# Patient Record
Sex: Male | Born: 1995
Health system: Southern US, Community
[De-identification: ages and names within clinical notes are randomized; demographics above are authoritative.]

## PROBLEM LIST (undated history)

## (undated) DIAGNOSIS — Z8679 Personal history of other diseases of the circulatory system: Secondary | ICD-10-CM

## (undated) DIAGNOSIS — K219 Gastro-esophageal reflux disease without esophagitis: Secondary | ICD-10-CM

## (undated) DIAGNOSIS — H7291 Unspecified perforation of tympanic membrane, right ear: Secondary | ICD-10-CM

## (undated) DIAGNOSIS — T7840XA Allergy, unspecified, initial encounter: Secondary | ICD-10-CM

## (undated) DIAGNOSIS — R1115 Cyclical vomiting syndrome unrelated to migraine: Secondary | ICD-10-CM

## (undated) DIAGNOSIS — E538 Deficiency of other specified B group vitamins: Secondary | ICD-10-CM

## (undated) HISTORY — PX: WISDOM TOOTH EXTRACTION: SHX21

## (undated) HISTORY — DX: Unspecified perforation of tympanic membrane, right ear: H72.91

## (undated) HISTORY — DX: Allergy, unspecified, initial encounter: T78.40XA

## (undated) HISTORY — PX: TYMPANOSTOMY TUBE PLACEMENT: SHX32

## (undated) HISTORY — DX: Deficiency of other specified B group vitamins: E53.8

## (undated) HISTORY — DX: Gastro-esophageal reflux disease without esophagitis: K21.9

## (undated) HISTORY — DX: Personal history of other diseases of the circulatory system: Z86.79

## (undated) HISTORY — DX: Cyclical vomiting syndrome unrelated to migraine: R11.15

---
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
## 2016-06-14 DIAGNOSIS — H9211 Otorrhea, right ear: Secondary | ICD-10-CM | POA: Insufficient documentation

## 2016-06-22 DIAGNOSIS — H9011 Conductive hearing loss, unilateral, right ear, with unrestricted hearing on the contralateral side: Secondary | ICD-10-CM | POA: Insufficient documentation

## 2016-07-04 ENCOUNTER — Other Ambulatory Visit: Payer: Self-pay | Admitting: Otolaryngology

## 2016-07-04 DIAGNOSIS — H7121 Cholesteatoma of mastoid, right ear: Secondary | ICD-10-CM

## 2016-07-04 DIAGNOSIS — H9011 Conductive hearing loss, unilateral, right ear, with unrestricted hearing on the contralateral side: Secondary | ICD-10-CM

## 2016-07-06 ENCOUNTER — Other Ambulatory Visit: Payer: Self-pay

## 2016-07-12 ENCOUNTER — Ambulatory Visit
Admission: RE | Admit: 2016-07-12 | Discharge: 2016-07-12 | Disposition: A | Discharge status: Home / Self Care | Payer: Self-pay | Source: Ambulatory Visit | Attending: Otolaryngology | Admitting: Otolaryngology

## 2016-07-12 DIAGNOSIS — H7121 Cholesteatoma of mastoid, right ear: Secondary | ICD-10-CM

## 2016-07-12 DIAGNOSIS — H9011 Conductive hearing loss, unilateral, right ear, with unrestricted hearing on the contralateral side: Secondary | ICD-10-CM

## 2017-03-20 ENCOUNTER — Telehealth: Payer: Self-pay | Admitting: *Deleted

## 2017-03-20 NOTE — Telephone Encounter (Signed)
Received records from [facility].  Records have been put on Dr. Samul Dada desk for review.

## 2017-03-24 ENCOUNTER — Encounter: Payer: Self-pay | Admitting: Family Medicine

## 2017-03-27 ENCOUNTER — Encounter: Payer: Self-pay | Admitting: Family Medicine

## 2017-03-27 ENCOUNTER — Ambulatory Visit (INDEPENDENT_AMBULATORY_CARE_PROVIDER_SITE_OTHER): Payer: Managed Care, Other (non HMO) | Admitting: Family Medicine

## 2017-03-27 VITALS — BP 128/70 | HR 72 | Temp 98.7°F | Resp 16 | Ht 68.5 in | Wt 188.5 lb

## 2017-03-27 DIAGNOSIS — G43A0 Cyclical vomiting, not intractable: Secondary | ICD-10-CM | POA: Diagnosis not present

## 2017-03-27 DIAGNOSIS — R1084 Generalized abdominal pain: Secondary | ICD-10-CM

## 2017-03-27 DIAGNOSIS — R1115 Cyclical vomiting syndrome unrelated to migraine: Secondary | ICD-10-CM

## 2017-03-27 MED ORDER — OMEPRAZOLE 40 MG PO CPDR: 40.0000 mg | DELAYED_RELEASE_CAPSULE | Freq: Every day | ORAL | 3 refills | Status: DC

## 2017-03-27 MED ORDER — AMITRIPTYLINE HCL 25 MG PO TABS: 25.0000 mg | ORAL_TABLET | Freq: Every day | ORAL | 0 refills | Status: DC

## 2017-03-27 NOTE — Progress Notes (Signed)
Office Note 03/27/2017  CC:  Chief Complaint  Patient presents with  . Establish Care    Previous PCP: [facility] [place], [place]  . Digestive Issues   HPI:  Martin Terry is a 21 y.o.  male who is here to establish care Patient's most recent primary MD: MD in [place], [place]. Old records were reviewed prior to or during today's visit.  He also brought in a record of GTT done 10/2014.  This was done b/c he was having some fatigue problems. However, the paperwork does not have any result on it.  A cbc attached is normal.  Approx the last few months he has intermittent postprandial upper abd pain with nausea but no vomiting.  Happens on avg about 1-2 times per week, and this initially was happening 1-2 times per month. Not triggered by any particular food.  Usually happens at supper meal.  Usually happens while he is still eating. He says it seems to resolve after 15 min or so, goes through a lot of belching during this time. Feels bloated during this time.  No diarrhea.  No lower abd cramping.   Hx of cyclic vomiting in past that was responsive to getting under hot water/shower.  He does this when current sx's occur and this helps.  Says his past episodes of cyclic vomiting started like this. Was rx'd a med in past to take prior to an episode back in 2012 but he can't recall what it was.  ?migraine med? He can't recall what/if any imaging or blood w/u was done regarding his hx of cyclic vomiting.   Past Medical History:  Diagnosis Date  . Allergy   . Cyclical vomiting 2005   2005, again 2012.  Marland Kitchen GERD (gastroesophageal reflux disease)   . History of heart murmur in childhood    Age 48; eval by cardiologist for a few episodes of mildly elevated bp,  This cardiac eval was normal, including echo and EKG (2012).    Past Surgical History:  Procedure Laterality Date  . ADENOIDECTOMY  2000  . TYMPANOSTOMY TUBE PLACEMENT     with hx of TM perf on R as of 08/2016 f/u with Dr.  Jenne Pane.  Hx of conductive hearing loss.  . WISDOM TOOTH EXTRACTION      Family History  Problem Relation Age of Onset  . Miscarriages / [place] Mother   . Hearing loss Father   . Hyperlipidemia Father   . Arthritis Maternal Grandmother   . Hyperlipidemia Maternal Grandmother   . Cancer Maternal Grandfather   . COPD Maternal Grandfather   . Diabetes Maternal Grandfather   . Cancer Paternal Grandmother   . Early death Paternal Grandmother   . Scleroderma Paternal Grandmother   . Hearing loss Paternal Grandfather   . Hyperlipidemia Paternal Grandfather     Social History   Social History  . Marital status: Single    Spouse name: N/A  . Number of children: N/A  . Years of education: N/A   Occupational History  . Not on file.   Social History Main Topics  . Smoking status: Never Smoker  . Smokeless tobacco: Never Used  . Alcohol use No  . Drug use: No  . Sexual activity: Not on file   Other Topics Concern  . Not on file   Social History Narrative   Single, no children.   Educ: [employer].A[employer]   Relocated from [place] to [facility] 2016.   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  No T/A/Ds.    Outpatient Encounter Prescriptions as of 03/27/2017  Medication Sig  . amitriptyline (ELAVIL) 25 MG tablet Take 1 tablet (25 mg total) by mouth at bedtime.  Marland Kitchen omeprazole (PRILOSEC) 40 MG capsule Take 1 capsule (40 mg total) by mouth daily.   No facility-administered encounter medications on file as of 03/27/2017.     Allergies  Allergen Reactions  . Ondansetron Hcl     Other reaction(s): Other (See Comments) unknown    ROS Review of Systems  Constitutional: Negative for fatigue and fever.  HENT: Negative for congestion and sore throat.   Eyes: Negative for visual disturbance.  Respiratory: Negative for cough.   Cardiovascular: Negative for chest pain.  Gastrointestinal: Positive for abdominal pain and nausea.  Genitourinary: Negative for dysuria.   Musculoskeletal: Negative for back pain and joint swelling.  Skin: Negative for rash.  Neurological: Negative for weakness and headaches.  Hematological: Negative for adenopathy.    PE; Blood pressure 128/70, pulse 72, temperature 98.7 F (37.1 C), temperature source Oral, resp. rate 16, height 5' 8.5" (1.74 m), weight 188 lb 8 oz (85.5 kg), SpO2 98 %. Body mass index is 28.24 kg/m.  Gen: Alert, well appearing.  Patient is oriented to person, place, time, and situation. AFFECT: pleasant, lucid thought and speech. Neck - No masses or thyromegaly or limitation in range of motion CV: RRR, no m/r/g.   LUNGS: CTA bilat, nonlabored resps, good aeration in all lung fields. ABD: soft, NT, ND, BS normal.  No hepatospenomegaly or mass.  No bruits. EXT: no clubbing, cyanosis, or edema.  Skin - no sores or suspicious lesions or rashes or color changes  Pertinent labs:  None  ASSESSMENT AND PLAN:   Cyclic vomiting syndrome: this is the way his 2 episodes of cyclic vomiting syndrome have started out in the past. Will check abd u/s to r/o cholelithiasis.  Also, check CBC, CMET, lipase. Start amitriptyline  qhs for prophylaxis treatment [facility].  Therapeutic expectations and side effect profile of medication discussed today.  Patient's questions answered. Discussed possible need for up-titration slowly. May consider triptan trial as abortive med if episodes become more severe or extended.  An After Visit Summary was printed and given to the patient.  Return in about 2 weeks (around 04/10/2017) for f/u abd pain/nausea.  Signed:  Santiago Bumpers, MD           03/27/2017

## 2017-03-28 ENCOUNTER — Other Ambulatory Visit: Payer: Self-pay | Admitting: Family Medicine

## 2017-03-28 ENCOUNTER — Encounter: Payer: Self-pay | Admitting: Family Medicine

## 2017-03-28 ENCOUNTER — Telehealth: Payer: Self-pay | Admitting: *Deleted

## 2017-03-28 LAB — CBC WITH DIFFERENTIAL/PLATELET
BASOS PCT: 1.2 % (ref 0.0–3.0)
Basophils Absolute: 0.1 10*3/uL (ref 0.0–0.1)
EOS ABS: 0.2 10*3/uL (ref 0.0–0.7)
Eosinophils Relative: 3.1 % (ref 0.0–5.0)
HCT: 45.3 % (ref 39.0–52.0)
Hemoglobin: 15.4 g/dL (ref 13.0–17.0)
LYMPHS ABS: 1.7 10*3/uL (ref 0.7–4.0)
Lymphocytes Relative: 33.8 % (ref 12.0–46.0)
MCHC: 33.9 g/dL (ref 30.0–36.0)
MCV: 87.3 fl (ref 78.0–100.0)
MONO ABS: 0.3 10*3/uL (ref 0.1–1.0)
Monocytes Relative: 6 % (ref 3.0–12.0)
NEUTROS ABS: 2.9 10*3/uL (ref 1.4–7.7)
Neutrophils Relative %: 55.9 % (ref 43.0–77.0)
PLATELETS: 248 10*3/uL (ref 150.0–400.0)
RBC: 5.19 Mil/uL (ref 4.22–5.81)
RDW: 13.3 % (ref 11.5–15.5)
WBC: 5.1 10*3/uL (ref 4.0–10.5)

## 2017-03-28 LAB — COMPREHENSIVE METABOLIC PANEL
ALBUMIN: 5 g/dL (ref 3.5–5.2)
ALT: 20 U/L (ref 0–53)
AST: 16 U/L (ref 0–37)
Alkaline Phosphatase: 47 U/L (ref 39–117)
BUN: 17 mg/dL (ref 6–23)
CALCIUM: 9.9 mg/dL (ref 8.4–10.5)
CHLORIDE: 103 meq/L (ref 96–112)
CO2: 30 mEq/L (ref 19–32)
Creatinine, Ser: 1.22 mg/dL (ref 0.40–1.50)
GFR: 78.26 mL/min (ref >60.00)
Glucose, Bld: 101 mg/dL — ABNORMAL HIGH (ref 70–99)
POTASSIUM: 4.3 meq/L (ref 3.5–5.1)
SODIUM: 139 meq/L (ref 135–145)
Total Bilirubin: 0.5 mg/dL (ref 0.2–1.2)
Total Protein: 7.2 g/dL (ref 6.0–8.3)

## 2017-03-28 LAB — LIPASE: LIPASE: 28 U/L (ref 11.0–59.0)

## 2017-03-28 MED ORDER — CYPROHEPTADINE HCL 4 MG PO TABS: ORAL_TABLET | ORAL | 1 refills | Status: DC

## 2017-03-28 NOTE — Telephone Encounter (Signed)
Received records from [facility].  Records put on Dr. Verdis Frederickson desk for review.

## 2017-03-28 NOTE — Telephone Encounter (Signed)
Please advise. Thanks.  

## 2017-03-29 ENCOUNTER — Encounter: Payer: Self-pay | Admitting: *Deleted

## 2017-03-31 ENCOUNTER — Encounter: Payer: Self-pay | Admitting: Family Medicine

## 2017-03-31 NOTE — Telephone Encounter (Signed)
Reviewed

## 2017-04-01 NOTE — Telephone Encounter (Signed)
Reviewed.  Signed:  Santiago Bumpers, MD           04/01/2017

## 2017-04-02 ENCOUNTER — Ambulatory Visit (HOSPITAL_BASED_OUTPATIENT_CLINIC_OR_DEPARTMENT_OTHER)
Admission: RE | Admit: 2017-04-02 | Discharge: 2017-04-02 | Disposition: A | Discharge status: Home / Self Care | Payer: Managed Care, Other (non HMO) | Source: Ambulatory Visit | Attending: Family Medicine | Admitting: Family Medicine

## 2017-04-02 ENCOUNTER — Encounter: Payer: Self-pay | Admitting: Family Medicine

## 2017-04-02 ENCOUNTER — Encounter: Payer: Self-pay | Admitting: *Deleted

## 2017-04-02 DIAGNOSIS — G43A0 Cyclical vomiting, not intractable: Secondary | ICD-10-CM | POA: Diagnosis present

## 2017-04-02 DIAGNOSIS — R1115 Cyclical vomiting syndrome unrelated to migraine: Secondary | ICD-10-CM

## 2017-04-02 DIAGNOSIS — R1084 Generalized abdominal pain: Secondary | ICD-10-CM | POA: Diagnosis not present

## 2017-04-15 ENCOUNTER — Encounter: Payer: Self-pay | Admitting: Family Medicine

## 2017-04-15 ENCOUNTER — Ambulatory Visit (INDEPENDENT_AMBULATORY_CARE_PROVIDER_SITE_OTHER): Payer: Managed Care, Other (non HMO) | Admitting: Family Medicine

## 2017-04-15 VITALS — BP 119/67 | HR 72 | Temp 98.4°F | Resp 16 | Ht 68.5 in | Wt 188.0 lb

## 2017-04-15 DIAGNOSIS — G43A0 Cyclical vomiting, not intractable: Secondary | ICD-10-CM

## 2017-04-15 DIAGNOSIS — K219 Gastro-esophageal reflux disease without esophagitis: Secondary | ICD-10-CM | POA: Diagnosis not present

## 2017-04-15 DIAGNOSIS — R1115 Cyclical vomiting syndrome unrelated to migraine: Secondary | ICD-10-CM

## 2017-04-15 NOTE — Progress Notes (Signed)
OFFICE VISIT  04/15/2017   CC:  Chief Complaint  Patient presents with  . Follow-up    Abdominal Pain and Nausea   HPI:    Patient is a 21 y.o.  male who presents accompanied by his mom for 3 week f/u abdominal complaints that were worrisome for the beginning of another episode of cyclic vomiting.  Abd u/s done right after last visit was normal. He feels improved.    No more nausea.  Just lots of burping after he eats.  Admits to indigestion and sometimes a burning sensation in substernal area.  Takes tums and sometimes gas-ex and these help.  He did not fill the prilosec that I rx'd last visit.  He felt like he did not need to start periactin or amitriptyline b/c sx's abated since last visit. He is in favor of establishing with a local GI MD given his history of significant cyclic vomiting syndrome.    Past Medical History:  Diagnosis Date  . Allergy   . Cyclical vomiting 2005   2005, again 2012.  Abd u/s NORMAL 04/2017.  Marland Kitchen GERD (gastroesophageal reflux disease)    Celiac w/u neg, 2H GTT NORMAL (2016)  . History of heart murmur in childhood    Age 17; eval by cardiologist for a few episodes of mildly elevated bp,  This cardiac eval was normal, including echo and EKG (2012).    Past Surgical History:  Procedure Laterality Date  . ADENOIDECTOMY  2000  . TYMPANOSTOMY TUBE PLACEMENT     with hx of TM perf on R as of 08/2016 f/u with Dr. Jenne Pane.  Hx of conductive hearing loss.  . WISDOM TOOTH EXTRACTION      Outpatient Medications Prior to Visit  Medication Sig Dispense Refill  . amitriptyline (ELAVIL) 25 MG tablet Take 1 tablet (25 mg total) by mouth at bedtime. (Patient not taking: Reported on 04/15/2017) 30 tablet 0  . cyproheptadine (PERIACTIN) 4 MG tablet 1 tab po tid as needed for abdominal and nausea symptoms (Patient not taking: Reported on 04/15/2017) 45 tablet 1  . omeprazole (PRILOSEC) 40 MG capsule Take 1 capsule (40 mg total) by mouth daily. (Patient not taking:  Reported on 04/15/2017) 30 capsule 3   No facility-administered medications prior to visit.     Allergies  Allergen Reactions  . Ondansetron Hcl     Other reaction(s): Other (See Comments) unknown    ROS As per HPI  PE: Blood pressure 119/67, pulse 72, temperature 98.4 F (36.9 C), temperature source Oral, resp. rate 16, height 5' 8.5" (1.74 m), weight 188 lb (85.3 kg), SpO2 100 %. Gen: Alert, well appearing.  Patient is oriented to person, place, time, and situation. AFFECT: pleasant, lucid thought and speech. No further exam today.  LABS:  No results found for: TSH Lab Results  Component Value Date   WBC 5.1 03/27/2017   HGB 15.4 03/27/2017   HCT 45.3 03/27/2017   MCV 87.3 03/27/2017   PLT 248.0 03/27/2017   Lab Results  Component Value Date   CREATININE 1.22 03/27/2017   BUN 17 03/27/2017   NA 139 03/27/2017   K 4.3 03/27/2017   CL 103 03/27/2017   CO2 30 03/27/2017   Lab Results  Component Value Date   ALT 20 03/27/2017   AST 16 03/27/2017   ALKPHOS 47 03/27/2017   BILITOT 0.5 03/27/2017    IMPRESSION AND PLAN:  1) Cyclic vomiting syndrome: it appears this is still "in remission". He has periactin at  home to start if needed, also will get in hot shower as needed to quell any symptoms that occasionally pop up. Referred to Dr. Rhea Belton with [facility] so pt could establish with local GI MD.  2) GERD: instructions---Take omeprazole as prescribed last visit. After you have felt significant improvement/resolution of your symptoms, either change to over the counter generic zantac  1-2 times per day for 2 weeks OR change to over the counter strength prilosec daily for 2 weeks, then stop meds. GERD educational handout reviewed and given to pt.  An After Visit Summary was printed and given to the patient.  FOLLOW UP: Return if symptoms worsen or fail to improve.  Signed:  Santiago Bumpers, MD           04/15/2017

## 2017-04-15 NOTE — Patient Instructions (Signed)
Take omeprazole as prescribed last visit.  After you have felt significant improvement/resolution of your symptoms, either change to over the counter generic zantac  1-2 times per day for 2 weeks OR change to over the counter strength prilosec daily for 2 weeks, then stop meds.  Gastroesophageal Reflux Disease, Adult Normally, food travels down the esophagus and stays in the stomach to be digested. However, when a person has gastroesophageal reflux disease (GERD), food and stomach acid move back up into the esophagus. When this happens, the esophagus becomes sore and inflamed. Over time, GERD can create small holes (ulcers) in the lining of the esophagus. What are the causes? This condition is caused by a problem with the muscle between the esophagus and the stomach (lower esophageal sphincter, or LES). Normally, the LES muscle closes after food passes through the esophagus to the stomach. When the LES is weakened or abnormal, it does not close properly, and that allows food and stomach acid to go back up into the esophagus. The LES can be weakened by certain dietary substances, medicines, and medical conditions, including:  Tobacco use.  Pregnancy.  Having a hiatal hernia.  Heavy alcohol use.  Certain foods and beverages, such as coffee, chocolate, onions, and peppermint.  What increases the risk? This condition is more likely to develop in:  People who have an increased body weight.  People who have connective tissue disorders.  People who use NSAID medicines.  What are the signs or symptoms? Symptoms of this condition include:  Heartburn.  Difficult or painful swallowing.  The feeling of having a lump in the throat.  Abitter taste in the mouth.  Bad breath.  Having a large amount of saliva.  Having an upset or bloated stomach.  Belching.  Chest pain.  Shortness of breath or wheezing.  Ongoing (chronic) cough or a night-time cough.  Wearing away of tooth  enamel.  Weight loss.  Different conditions can cause chest pain. Make sure to see your health care provider if you experience chest pain. How is this diagnosed? Your health care provider will take a medical history and perform a physical exam. To determine if you have mild or severe GERD, your health care provider may also monitor how you respond to treatment. You may also have other tests, including:  An endoscopy toexamine your stomach and esophagus with a small camera.  A test thatmeasures the acidity level in your esophagus.  A test thatmeasures how much pressure is on your esophagus.  A barium swallow or modified barium swallow to show the shape, size, and functioning of your esophagus.  How is this treated? The goal of treatment is to help relieve your symptoms and to prevent complications. Treatment for this condition may vary depending on how severe your symptoms are. Your health care provider may recommend:  Changes to your diet.  Medicine.  Surgery.  Follow these instructions at home: Diet  Follow a diet as recommended by your health care provider. This may involve avoiding foods and drinks such as: ? Coffee and tea (with or without caffeine). ? Drinks that containalcohol. ? Energy drinks and sports drinks. ? Carbonated drinks or sodas. ? Chocolate and cocoa. ? Peppermint and mint flavorings. ? Garlic and onions. ? Horseradish. ? Spicy and acidic foods, including peppers, chili powder, curry powder, vinegar, hot sauces, and barbecue sauce. ? Citrus fruit juices and citrus fruits, such as oranges, lemons, and limes. ? Tomato-based foods, such as red sauce, chili, salsa, and pizza with red  sauce. ? [place] and fatty foods, such as donuts, french fries, potato chips, and high-fat dressings. ? High-fat meats, such as hot dogs and fatty cuts of red and white meats, such as rib eye steak, sausage, ham, and bacon. ? High-fat dairy items, such as whole milk, butter, and  cream cheese.  Eat small, frequent meals instead of large meals.  Avoid drinking large amounts of liquid with your meals.  Avoid eating meals during the 2-3 hours before bedtime.  Avoid lying down right after you eat.  Do not exercise right after you eat. General instructions  Pay attention to any changes in your symptoms.  Take over-the-counter and prescription medicines only as told by your health care provider. Do not take aspirin, ibuprofen, or other NSAIDs unless your health care provider told you to do so.  Do not use any tobacco products, including cigarettes, chewing tobacco, and e-cigarettes. If you need help quitting, ask your health care provider.  Wear loose-fitting clothing. Do not wear anything tight around your waist that causes pressure on your abdomen.  Raise (elevate) the head of your bed 6 inches (15cm).  Try to reduce your stress, such as with yoga or meditation. If you need help reducing stress, ask your health care provider.  If you are overweight, reduce your weight to an amount that is healthy for you. Ask your health care provider for guidance about a safe weight loss goal.  Keep all follow-up visits as told by your health care provider. This is important. Contact a health care provider if:  You have new symptoms.  You have unexplained weight loss.  You have difficulty swallowing, or it hurts to swallow.  You have wheezing or a persistent cough.  Your symptoms do not improve with treatment.  You have a hoarse voice. Get help right away if:  You have pain in your arms, neck, jaw, teeth, or back.  You feel sweaty, dizzy, or light-headed.  You have chest pain or shortness of breath.  You vomit and your vomit looks like blood or coffee grounds.  You faint.  Your stool is bloody or black.  You cannot swallow, drink, or eat. This information is not intended to replace advice given to you by your health care provider. Make sure you discuss any  questions you have with your health care provider. Document Released: 03/28/2005 Document Revised: 11/16/2015 Document Reviewed: 10/13/2014 Elsevier Interactive Patient Education  2017 [employer].

## 2017-05-06 ENCOUNTER — Telehealth: Payer: Self-pay | Admitting: *Deleted

## 2017-05-06 NOTE — Telephone Encounter (Signed)
Noted  

## 2017-05-06 NOTE — Telephone Encounter (Signed)
Pts mother called stating that pt has finished the 14 day supply of omeprazole. She stated that his symptoms were improving but once he stopped the medication they came back. She wanted to know what pt should do. They are currently our of town. I reviewed last office visit and read off Dr. Samul DadaMcGowen's instructions to take Rx omeprazole as prescribed at last visit then start otc generic zantac or otc Prilosec x 2 weeks. She voiced understanding and will start pt on the Rx omeprazole.

## 2017-05-08 ENCOUNTER — Telehealth: Payer: Self-pay | Admitting: *Deleted

## 2017-05-08 NOTE — Telephone Encounter (Signed)
Pts mother advised and voiced understanding, okay per DPR.  

## 2017-05-08 NOTE — Telephone Encounter (Signed)
Pts mother LMOM on 05/08/17 at 2:48pm stating that [place] and pt was [facility] for cyclical vomiting. She stated that the ER didn't do much to help pt. She stated that they told her his WBC count was up but he did not have a fever. She stated that in the past Dr. Milinda CaveMcGowen has sent in antibiotic for pt and that has helped. She is requesting an antibiotic be sent to [facility] in [place], [place]. Please advise. Thanks.

## 2017-05-08 NOTE — Telephone Encounter (Signed)
SW pts mother, she stated that pts last cycle of this was in 2012 and he was seen at 2 different ERs for this. She stated that it was found that pt had an infection and they seemed to be what triggered his vomiting. She stated that no one looked at his ears when he was in the ER this last time. I advised her it may be best to take pt to an urgent care and have someone check his ears to see if he has an ear infection. She seemed hesitant because its had to get pt in a car when he is vomiting. I advised her that I will send this message back to Dr. Milinda CaveMcGowen and see what he will recommend. Please advise. Thanks.

## 2017-05-08 NOTE — Telephone Encounter (Signed)
Agree with recommendation to take pt to an urgent care center.-thx

## 2017-05-08 NOTE — Telephone Encounter (Signed)
There must be some confusion. I have seen him only twice, and I have never prescribed him an antibiotic. I have rx'd amitriptyline and generic prilosec, and he has taken periactin for his cyclic vomiting in the past. Is one of these meds possibly what his mom is referring to?

## 2017-05-14 ENCOUNTER — Encounter: Payer: Self-pay | Admitting: Internal Medicine

## 2017-05-16 ENCOUNTER — Other Ambulatory Visit: Payer: Self-pay

## 2017-05-16 ENCOUNTER — Encounter: Payer: Self-pay | Admitting: Family Medicine

## 2017-05-16 ENCOUNTER — Ambulatory Visit (INDEPENDENT_AMBULATORY_CARE_PROVIDER_SITE_OTHER): Payer: Managed Care, Other (non HMO) | Admitting: Family Medicine

## 2017-05-16 VITALS — BP 131/72 | HR 82 | Temp 98.2°F | Resp 16 | Ht 68.5 in | Wt 180.5 lb

## 2017-05-16 DIAGNOSIS — Z8489 Family history of other specified conditions: Secondary | ICD-10-CM

## 2017-05-16 DIAGNOSIS — H7291 Unspecified perforation of tympanic membrane, right ear: Secondary | ICD-10-CM

## 2017-05-16 DIAGNOSIS — H65491 Other chronic nonsuppurative otitis media, right ear: Secondary | ICD-10-CM | POA: Diagnosis not present

## 2017-05-16 DIAGNOSIS — G43A0 Cyclical vomiting, not intractable: Secondary | ICD-10-CM | POA: Diagnosis not present

## 2017-05-16 MED ORDER — AMOXICILLIN-POT CLAVULANATE 875-125 MG PO TABS: 1.0000 | ORAL_TABLET | Freq: Two times a day (BID) | ORAL | 0 refills | Status: DC

## 2017-05-16 MED ORDER — CIPROFLOXACIN-DEXAMETHASONE 0.3-0.1 % OT SUSP: 4.0000 [drp] | Freq: Two times a day (BID) | OTIC | 2 refills | Status: DC

## 2017-05-16 NOTE — Progress Notes (Signed)
OFFICE VISIT  05/16/2017   CC:  Chief Complaint  Patient presents with  . Nausea  . Emesis  . Belching    HPI:    Patient is a 21 y.o.  male who presents for f/u cyclical vomiting. Recently was in [place] and had 5 day period of vomiting, was able to keep down only pedialyte. He did got to ED and got IVF, benadryl, reglan--none of this helped.  Haldol was then given and he had an agitation response to this med.   Apparently ear infections (asymptomatic) often trigger his episodes but ED MD did not look in his ears. Sent him home with phenergan, was in hot water bath and eventually sx's resolved. Had very pronoiunced burping with this episode, which seems to have been new to him--this seemed to improve with omeprazole. Feels back to 100% as far as all those sx's go.   Past Medical History:  Diagnosis Date  . Allergy   . Cyclical vomiting 2005   2005, again 2012.  Abd u/s NORMAL 04/2017.  Marland Kitchen. GERD (gastroesophageal reflux disease)    Celiac w/u neg, 2H GTT NORMAL (2016)  . History of heart murmur in childhood    Age 21; eval by cardiologist for a few episodes of mildly elevated bp,  This cardiac eval was normal, including echo and EKG (2012).    Past Surgical History:  Procedure Laterality Date  . ADENOIDECTOMY  2000  . TYMPANOSTOMY TUBE PLACEMENT     with hx of TM perf on R as of 08/2016 f/u with Dr. Jenne PaneBates.  Hx of conductive hearing loss.  . WISDOM TOOTH EXTRACTION      Outpatient Medications Prior to Visit  Medication Sig Dispense Refill  . omeprazole (PRILOSEC OTC) 20 MG tablet Take 20 mg daily by mouth.     No facility-administered medications prior to visit.     Allergies  Allergen Reactions  . Haldol [Haloperidol Lactate] Other (See Comments)    Agitation  . Ondansetron Hcl     Other reaction(s): Other (See Comments) unknown    ROS As per HPI  PE: Blood pressure 131/72, pulse 82, temperature 98.2 F (36.8 C), temperature source Oral, resp. rate 16,  height 5' 8.5" (1.74 m), weight 180 lb 8 oz (81.9 kg), SpO2 99 %. Gen: Alert, well appearing.  Patient is oriented to person, place, time, and situation. AFFECT: pleasant, lucid thought and speech. EARS: Left EAC and TM normal. Right EAC normal, TM retracted, has small, round hole in TM in posterior-inferior aspect of TM, some clear fluid noted in middle ear.  No signif erythema of TM and no pus in middle ear.  No visible drainage occurring from small hole in TM presently.  LABS:  No results found for: TSH Lab Results  Component Value Date   WBC 5.1 03/27/2017   HGB 15.4 03/27/2017   HCT 45.3 03/27/2017   MCV 87.3 03/27/2017   PLT 248.0 03/27/2017   Lab Results  Component Value Date   CREATININE 1.22 03/27/2017   BUN 17 03/27/2017   NA 139 03/27/2017   K 4.3 03/27/2017   CL 103 03/27/2017   CO2 30 03/27/2017   Lab Results  Component Value Date   ALT 20 03/27/2017   AST 16 03/27/2017   ALKPHOS 47 03/27/2017   BILITOT 0.5 03/27/2017   IMPRESSION AND PLAN:  Cyclic vomiting syndrome: Recent episode, resolved now. Since R OM is common trigger in the past for his episodes, I gave rx today for  augmentin and ciprodex to have on hand. He'll continue omeprazole prn. Warm water bath at onset of sx's--this is very helpful in preventing a full blown episode for him. Periactin also on hand and in the past has been helpful. We discussed the possibility of him having acquired a hiatal hernia over the years with all the vomiting/wretching he does, and we decided not to do any diagnostics at this time to look for this problem.  He will possibly be getting EGD when he sees Dr. Rhea BeltonPyrtle (1st appt 07/19/17). Given mom's report of FH of mitochondrial dz and possible connection between cyclic vomiting syndrome and other neurologic/genetic diseases, they desire referral to geneticist for further evaluation of this. He has an app[facility] cyclic vomiting [facility] (Dr. Alycia RossettiKoch) 11/06/17.   Of note,  due to adverse rxn to Haldol while in [place] in and emergency dept, will add this to his med allergy/intolerance list in chart.  Spent 30 min with pt today, with >50% of this time spent in counseling and care coordination regarding the above problems.  An After Visit Summary was printed and given to the patient.  FOLLOW UP: Return for as needed.  Signed:  Santiago BumpersPhil Taya Ashbaugh, MD           05/16/2017

## 2017-05-17 ENCOUNTER — Other Ambulatory Visit: Payer: Self-pay | Admitting: Family Medicine

## 2017-05-17 ENCOUNTER — Encounter: Payer: Self-pay | Admitting: Family Medicine

## 2017-05-17 DIAGNOSIS — R1115 Cyclical vomiting syndrome unrelated to migraine: Secondary | ICD-10-CM

## 2017-05-17 NOTE — Telephone Encounter (Signed)
Please advise. Thanks.  

## 2017-06-06 ENCOUNTER — Encounter: Payer: Self-pay | Admitting: Family Medicine

## 2017-07-02 DIAGNOSIS — H7291 Unspecified perforation of tympanic membrane, right ear: Secondary | ICD-10-CM

## 2017-07-02 HISTORY — DX: Unspecified perforation of tympanic membrane, right ear: H72.91

## 2017-07-19 ENCOUNTER — Encounter: Payer: Self-pay | Admitting: Internal Medicine

## 2017-07-19 ENCOUNTER — Ambulatory Visit (INDEPENDENT_AMBULATORY_CARE_PROVIDER_SITE_OTHER): Payer: Managed Care, Other (non HMO) | Admitting: Internal Medicine

## 2017-07-19 VITALS — BP 104/70 | HR 72 | Ht 68.0 in | Wt 184.2 lb

## 2017-07-19 DIAGNOSIS — G43A0 Cyclical vomiting, not intractable: Secondary | ICD-10-CM

## 2017-07-19 DIAGNOSIS — R142 Eructation: Secondary | ICD-10-CM

## 2017-07-19 MED ORDER — RANITIDINE HCL 75 MG PO TABS: 75.0000 mg | ORAL_TABLET | Freq: Two times a day (BID) | ORAL | 2 refills | Status: DC

## 2017-07-19 NOTE — Patient Instructions (Signed)
You have been scheduled for an endoscopy. Please follow written instructions given to you at your visit today. If you use inhalers (even only as needed), please bring them with you on the day of your procedure. Your physician has requested that you go to www.startemmi.com and enter the access code given to you at your visit today. This web site gives a general overview about your procedure. However, you should still follow specific instructions given to you by our office regarding your preparation for the procedure.  We have sent the following medications to your pharmacy for you to pick up at your convenience: Zantac 75 mg twice daily  If you are age 24 or older, your body mass index should be between 23-30. Your Body mass index is 28.02 kg/m. If this is out of the aforementioned range listed, please consider follow up with your Primary Care Provider.  If you are age 24 or younger, your body mass index should be between 19-25. Your Body mass index is 28.02 kg/m. If this is out of the aformentioned range listed, please consider follow up with your Primary Care Provider.

## 2017-07-19 NOTE — Progress Notes (Signed)
Patient ID: Martin Terry, male   DOB: 01/13/1996, 22 y.o.   MRN: [id number] HPI: Martin Terry is a 22 year old male with a past medical history of recurrent nausea and vomiting felt to be most consistent with cyclic vomiting syndrome who is seen in consultation at the request of Dr. Marvel Plan to evaluate for CVS.  He is here today with his mother.  He reports that he has had issues with on and off nausea and vomiting dating back to when he was a young child, less than 22 years old.  When things started around age 22 he had cycles of nausea and vomiting and was later diagnosed with a "retracted eardrum" and underwent ear surgery and then his cycles of nausea vomiting stopped for about 7 years.  Then in 2012 he had a recurrent episode of severe nausea and vomiting requiring hospitalization for IV fluids.  It sounds like also at that time he had some neurologic symptoms including slurred speech and this led to an extensive workup in [place].  This included a CT and MRI of his brain which was reportedly normal.  This episode lasted on and off for nearly a week.  His most recent episode occurred in November 2018 while he was traveling in [place].  He went to an ER where he received hydration but also Haldol was to which she later had a reaction.  Again he remains sick for the better part of the week.  He reports that he has learned that when he develops nausea he can take a hot shower or bath and often abort a full attack of nausea and vomiting.  Once the vomiting starts it often continues unabated for several days.  He tries to remain hydrated with Pedialyte and also uses Phenergan.  He has a listed allergy to Zofran.  He estimates that he has nausea often once per week which is usually aborted by the above-mentioned measures.  He has noticed some belching and indigestion type symptoms.  He took Prilosec for about 2 weeks which seemed to help somewhat but he is now using Zantac 75 mg/day.  He thinks this helps to  some degree but has a hard time estimating how much.  He denies dysphagia.  No other abdominal pain other than during attacks.  Normal bowel movements without diarrhea, constipation, blood in his stool or melena.  He denies headaches.  Denies family history of migraines.  He had an abdominal ultrasound recently which was normal.  He has never had an endoscopy.  Family history notable for a sister with celiac disease.  Reportedly his prior celiac evaluation was negative.  His maternal grandparents had colon polyps.  His maternal grandfather had Crohn's disease.  He is single.  No tobacco use.  No illicit drug use, specifically no marijuana use now or previously.  No alcohol use.  He has an appointment in the nausea and vomiting clinic at [facility] in May with Dr. Alycia Rossetti, but wanted to start locally first.  Past Medical History:  Diagnosis Date  . Allergy   . Cyclical vomiting 2005   2005, again 2012.  Abd u/s NORMAL 04/2017.  Marland Kitchen GERD (gastroesophageal reflux disease)    Celiac w/u neg, 2H GTT NORMAL (2016)  . History of heart murmur in childhood    Age 39; eval by cardiologist for a few episodes of mildly elevated bp,  This cardiac eval was normal, including echo and EKG (2012).    Past Surgical History:  Procedure Laterality Date  .  ADENOIDECTOMY  2000  . TYMPANOSTOMY TUBE PLACEMENT     with hx of TM perf on R as of 08/2016 f/u with Dr. Jenne Pane.  Hx of conductive hearing loss.  . WISDOM TOOTH EXTRACTION      Outpatient Medications Prior to Visit  Medication Sig Dispense Refill  . ciprofloxacin-dexamethasone (CIPRODEX) OTIC suspension Place 4 drops 2 (two) times daily into the right ear. 7.5 mL 2  . ranitidine (ZANTAC) 75 MG tablet Take 75 mg by mouth daily.    Marland Kitchen amoxicillin-clavulanate (AUGMENTIN) 875-125 MG tablet Take 1 tablet 2 (two) times daily by mouth. 20 tablet 0  . omeprazole (PRILOSEC OTC) 20 MG tablet Take 20 mg daily by mouth.     No facility-administered medications  prior to visit.     Allergies  Allergen Reactions  . Haldol [Haloperidol Lactate] Other (See Comments)    Agitation  . Zofran [Ondansetron Hcl]     Other reaction(s): Other (See Comments) unknown    Family History  Problem Relation Age of Onset  . Miscarriages / [place] Mother   . Hearing loss Father   . Hyperlipidemia Father   . Arthritis Maternal Grandmother   . Hyperlipidemia Maternal Grandmother   . Colon polyps Maternal Grandmother   . COPD Maternal Grandfather   . Diabetes Maternal Grandfather   . Crohn's disease Maternal Grandfather   . Bladder Cancer Maternal Grandfather   . Colon polyps Maternal Grandfather   . Early death Paternal Grandmother   . Scleroderma Paternal Grandmother   . Breast cancer Paternal Grandmother   . Hearing loss Paternal Grandfather   . Hyperlipidemia Paternal Grandfather   . Celiac disease Sister     Social History   Tobacco Use  . Smoking status: Never Smoker  . Smokeless tobacco: Never Used  Substance Use Topics  . Alcohol use: No  . Drug use: No    ROS: As per history of present illness, otherwise negative  BP 104/70 (BP Location: Left Arm, Patient Position: Sitting, Cuff Size: Normal)   Pulse 72   Ht 5\' 8"  (1.727 m) Comment: height measured without shoes  Wt 184 lb 4 oz (83.6 kg)   BMI 28.02 kg/m  Constitutional: Well-developed and well-nourished. No distress. HEENT: Normocephalic and atraumatic. Oropharynx is clear and moist. Conjunctivae are normal.  No scleral icterus. Neck: Neck supple. Trachea midline. Cardiovascular: Normal rate, regular rhythm and intact distal pulses. No M/R/G Pulmonary/chest: Effort normal and breath sounds normal. No wheezing, rales or rhonchi. Abdominal: Soft, nontender, nondistended. Bowel sounds active throughout. There are no masses palpable. No hepatosplenomegaly. Extremities: no clubbing, cyanosis, or edema Neurological: Alert and oriented to person place and time. Skin: Skin is warm  and dry.  Psychiatric: Normal mood and affect. Behavior is normal.  RELEVANT LABS AND IMAGING: CBC    Component Value Date/Time   WBC 5.1 03/27/2017 1526   RBC 5.19 03/27/2017 1526   HGB 15.4 03/27/2017 1526   HCT 45.3 03/27/2017 1526   PLT 248.0 03/27/2017 1526   MCV 87.3 03/27/2017 1526   MCHC 33.9 03/27/2017 1526   RDW 13.3 03/27/2017 1526   LYMPHSABS 1.7 03/27/2017 1526   MONOABS 0.3 03/27/2017 1526   EOSABS 0.2 03/27/2017 1526   BASOSABS 0.1 03/27/2017 1526    CMP     Component Value Date/Time   NA 139 03/27/2017 1526   K 4.3 03/27/2017 1526   CL 103 03/27/2017 1526   CO2 30 03/27/2017 1526   GLUCOSE 101 (H) 03/27/2017 1526  BUN 17 03/27/2017 1526   CREATININE 1.22 03/27/2017 1526   CALCIUM 9.9 03/27/2017 1526   PROT 7.2 03/27/2017 1526   ALBUMIN 5.0 03/27/2017 1526   AST 16 03/27/2017 1526   ALT 20 03/27/2017 1526   ALKPHOS 47 03/27/2017 1526   BILITOT 0.5 03/27/2017 1526   ABDOMEN ULTRASOUND COMPLETE   COMPARISON:  None in PACs   FINDINGS: Gallbladder: No gallstones or wall thickening visualized. No sonographic Murphy sign noted by sonographer.   Common bile duct: Diameter: 2.7 mm   Liver: No focal lesion identified. Within normal limits in parenchymal echogenicity. Portal vein is patent on color Doppler imaging with normal direction of blood flow towards the liver.   IVC: No abnormality visualized.   Pancreas: The pancreatic body appears normal. The pancreatic head and tail are obscured by bowel gas.   Spleen: Size and appearance within normal limits.   Right Kidney: Length: 9.8 cm. Echogenicity within normal limits. No mass or hydronephrosis visualized.   Left Kidney: Length: 11.2 cm. Echogenicity within normal limits. No mass or hydronephrosis visualized.   Abdominal aorta: No aneurysm visualized.   Other findings: There is no ascites.   IMPRESSION: No gallstones or sonographic evidence of acute cholecystitis. If there are clinical  concerns of chronic gallbladder dysfunction, a nuclear medicine hepatobiliary scan with gallbladder ejection fraction determination may be useful.   No abnormality observed elsewhere within the abdomen.     Electronically Signed   By: David  SwazilandJordan M.D.   On: 04/02/2017 11:51  ASSESSMENT/PLAN: 22 year old male with a past medical history of recurrent nausea and vomiting felt to be most consistent with cyclic vomiting syndrome who is seen in consultation at the request of Dr. Marvel PlanMcGowan to evaluate for CVS.  1. CVS --his episodes meet criteria for cyclic vomiting syndrome.  He is not currently taking any prophylactic therapy such as amitriptyline.  Dr. Marvel PlanMcGowan previously prescribed amitriptyline and cyproheptadine but he has not used these. --I have recommended upper endoscopy to rule out structural abnormalities.  Plan gastric biopsies to exclude H. pylori and small bowel biopsies to exclude celiac disease and other inflammatory enteritis.  These are felt less likely given lack of symptoms between episodes. --He would be an excellent candidate for amitriptyline therapy but we will proceed with upper endoscopy first.  He also may benefit from co-Q10 and l-carnitine. --For now he will continue Zantac 75 mg but I asked that he increase this to twice daily for more continuous acid suppression, at least until endoscopy.  We discussed the risks, benefits and alternatives to upper endoscopy and he is agreeable and wishes to proceed   ZO:XWRUEAVCc:Mcgowen, Maryjean MornPhilip H, Md [address] [place], [place] [id number]

## 2017-07-24 ENCOUNTER — Encounter: Payer: Self-pay | Admitting: Family Medicine

## 2017-07-26 ENCOUNTER — Encounter: Payer: Self-pay | Admitting: Internal Medicine

## 2017-07-26 ENCOUNTER — Ambulatory Visit (AMBULATORY_SURGERY_CENTER): Payer: Managed Care, Other (non HMO) | Admitting: Internal Medicine

## 2017-07-26 ENCOUNTER — Other Ambulatory Visit: Payer: Self-pay

## 2017-07-26 VITALS — BP 117/56 | HR 71 | Temp 98.7°F | Resp 10 | Ht 68.0 in | Wt 184.0 lb

## 2017-07-26 DIAGNOSIS — G43A0 Cyclical vomiting, not intractable: Secondary | ICD-10-CM | POA: Diagnosis present

## 2017-07-26 HISTORY — PX: ESOPHAGOGASTRODUODENOSCOPY: SHX1529

## 2017-07-26 MED ORDER — SODIUM CHLORIDE 0.9 % IV SOLN: 500.0000 mL | Freq: Once | INTRAVENOUS | Status: DC

## 2017-07-26 NOTE — Progress Notes (Signed)
Report to PACU, RN, vss, BBS= Clear.  

## 2017-07-26 NOTE — Progress Notes (Signed)
Called to room to assist during endoscopic procedure.  Patient ID and intended procedure confirmed with present staff. Received instructions for my participation in the procedure from the performing physician.  

## 2017-07-26 NOTE — Op Note (Signed)
[facility] Patient Name: Buell Parcel Procedure Date: 07/26/2017 9:27 AM MRN: [id number] Endoscopist: Beverley Fiedler , MD Age: 22 Referring MD:  Date of Birth: 1996-04-23 Gender: Male Account #: [id number] Procedure:                Upper GI endoscopy Indications:              Nausea with vomiting Medicines:                Monitored Anesthesia Care Procedure:                Pre-Anesthesia Assessment:                           - Prior to the procedure, a History and Physical                            was performed, and patient medications and                            allergies were reviewed. The patient's tolerance of                            previous anesthesia was also reviewed. The risks                            and benefits of the procedure and the sedation                            options and risks were discussed with the patient.                            All questions were answered, and informed consent                            was obtained. Prior Anticoagulants: The patient has                            taken no previous anticoagulant or antiplatelet                            agents. ASA Grade Assessment: II - A patient with                            mild systemic disease. After reviewing the risks                            and benefits, the patient was deemed in                            satisfactory condition to undergo the procedure.                           After obtaining informed consent, the endoscope was  passed under direct vision. Throughout the                            procedure, the patient's blood pressure, pulse, and                            oxygen saturations were monitored continuously. The                            Endoscope was introduced through the mouth, and                            advanced to the second part of duodenum. The upper                            GI endoscopy was accomplished without  difficulty.                            The patient tolerated the procedure well. Scope In: Scope Out: Findings:                 The examined esophagus was normal.                           A 1 cm hiatal hernia was present.                           The entire examined stomach was normal. Biopsies                            were taken with a cold forceps for histology and                            Helicobacter pylori testing.                           The examined duodenum was normal. Biopsies for                            histology were taken with a cold forceps for                            evaluation of celiac disease. Complications:            No immediate complications. Estimated Blood Loss:     Estimated blood loss was minimal. Impression:               - Normal esophagus.                           - 1 cm hiatal hernia.                           - Normal stomach. Biopsied.                           -  Normal examined duodenum. Biopsied. Recommendation:           - Patient has a contact number available for                            emergencies. The signs and symptoms of potential                            delayed complications were discussed with the                            patient. Return to normal activities tomorrow.                            Written discharge instructions were provided to the                            patient.                           - Resume previous diet.                           - Continue present medications.                           - Await pathology results.                           - If biopsies unrevealing, I recommend                            amitriptyline, l-carnitine, and Co-Q10 for                            prophylaxis of CVS. Does will be communicated to                            patient after pathology results reviewed. Beverley FiedlerJay M Pyrtle, MD 07/26/2017 9:57:09 AM This report has been signed electronically.

## 2017-07-26 NOTE — Progress Notes (Signed)
No soy or egg allegyHistory reviewed today 

## 2017-07-26 NOTE — Patient Instructions (Signed)
YOU HAD AN ENDOSCOPIC PROCEDURE TODAY AT THE [facility]:   Refer to the procedure report that was given to you for any specific questions about what was found during the examination.  If the procedure report does not answer your questions, please call your gastroenterologist to clarify.  If you requested that your care partner not be given the details of your procedure findings, then the procedure report has been included in a sealed envelope for you to review at your convenience later.  YOU SHOULD EXPECT: Some feelings of bloating in the abdomen. Passage of more gas than usual.  Walking can help get rid of the air that was put into your GI tract during the procedure and reduce the bloating. If you had a lower endoscopy (such as a colonoscopy or flexible sigmoidoscopy) you may notice spotting of blood in your stool or on the toilet paper. If you underwent a bowel prep for your procedure, you may not have a normal bowel movement for a few days.  Please Note:  You might notice some irritation and congestion in your nose or some drainage.  This is from the oxygen used during your procedure.  There is no need for concern and it should clear up in a day or so.  SYMPTOMS TO REPORT IMMEDIATELY:   FFollowing upper endoscopy (EGD)  Vomiting of blood or coffee ground material  New chest pain or pain under the shoulder blades  Painful or persistently difficult swallowing  New shortness of breath  Fever of 100F or higher  Black, tarry-looking stools  For urgent or emergent issues, a gastroenterologist can be reached at any hour by calling [phone].   DIET:  We do recommend a small meal at first, but then you may proceed to your regular diet.  Drink plenty of fluids but you should avoid alcoholic beverages for 24 hours.  ACTIVITY:  You should plan to take it easy for the rest of today and you should NOT DRIVE or use heavy machinery until tomorrow (because of the sedation medicines used  during the test).    FOLLOW UP: Our staff will call the number listed on your records the next business day following your procedure to check on you and address any questions or concerns that you may have regarding the information given to you following your procedure. If we do not reach you, we will leave a message.  However, if you are feeling well and you are not experiencing any problems, there is no need to return our call.  We will assume that you have returned to your regular daily activities without incident.  If any biopsies were taken you will be contacted by phone or by letter within the next 1-3 weeks.  Please call us at [phone] if you have not heard about the biopsies in 3 weeks.    SIGNATURES/CONFIDENTIALITY: You and/or your care partner have signed paperwork which will be entered into your electronic medical record.  These signatures attest to the fact that that the information above on your After Visit Summary has been reviewed and is understood.  Full responsibility of the confidentiality of this discharge information lies with you and/or your care-partner. 

## 2017-07-29 ENCOUNTER — Telehealth: Payer: Self-pay

## 2017-07-29 NOTE — Telephone Encounter (Signed)
  Follow up Call-  Call back number 07/26/2017  Post procedure Call Back phone  # [phone]  Permission to leave phone message Yes  Some recent data might be hidden     Reached a male voice.  He reported that this is the wrong number. Martin Terry/Follow-up call.

## 2017-07-29 NOTE — Telephone Encounter (Signed)
Called [phone] and left a messaged we tried to reach pt for a follow up call. maw

## 2017-07-30 ENCOUNTER — Encounter: Payer: Self-pay | Admitting: Family Medicine

## 2017-07-31 ENCOUNTER — Other Ambulatory Visit: Payer: Self-pay

## 2017-07-31 ENCOUNTER — Encounter: Payer: Self-pay | Admitting: Family Medicine

## 2017-07-31 MED ORDER — AMITRIPTYLINE HCL 50 MG PO TABS: 50.0000 mg | ORAL_TABLET | Freq: Every day | ORAL | 6 refills | Status: DC

## 2017-08-01 ENCOUNTER — Telehealth: Payer: Self-pay

## 2017-08-01 ENCOUNTER — Encounter: Payer: Self-pay | Admitting: Internal Medicine

## 2017-08-01 NOTE — Telephone Encounter (Signed)
Patient aware.

## 2017-08-01 NOTE — Telephone Encounter (Signed)
Pt wants to know if he  should  continue with Zantac 75 twice daily. Please advise.

## 2017-08-01 NOTE — Telephone Encounter (Signed)
I feel that he can discontinue Zantac without risk of exacerbating CVS If after stopping, he has any negative symptoms develop please have him let me know

## 2017-08-12 ENCOUNTER — Encounter: Payer: Self-pay | Admitting: Family Medicine

## 2017-11-02 IMAGING — CT CT TEMPORAL BONES W/O CM
1 series · 15 of 29 positions shown, 19 images · non-contrast
Comparison: None.

CLINICAL DATA: Conducted Hearing loss in RIGHT ear with drainage.
Evaluate for cholesteatoma.

EXAM:
CT TEMPORAL BONES WITHOUT CONTRAST
TECHNIQUE: Axial and coronal plane CT imaging of the petrous temporal bones was
performed with thin-collimation image reconstruction. No intravenous
contrast was administered. Multiplanar CT image reconstructions were
also generated.

[Series 4: brain · axial · 0.47mm/px · z∈[-203,-151]mm · 15 of 29 slices shown, 19 images]
[im 2/29  brain]
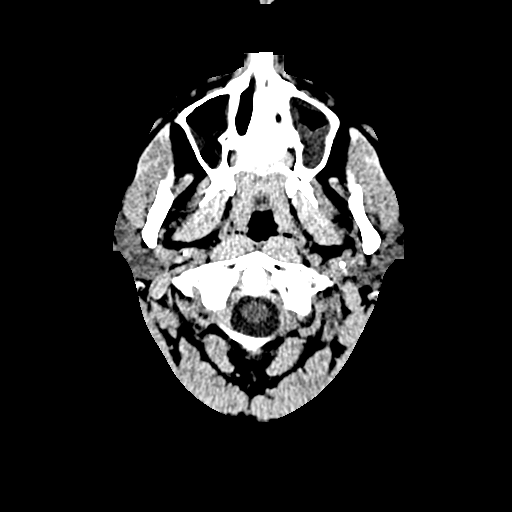
[im 2/29  bone]
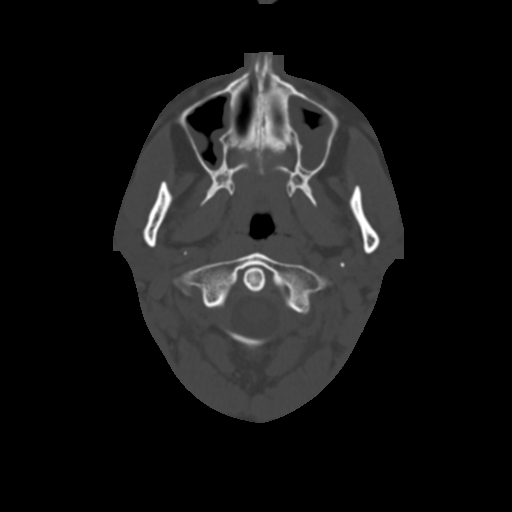
[im 4/29  bone]
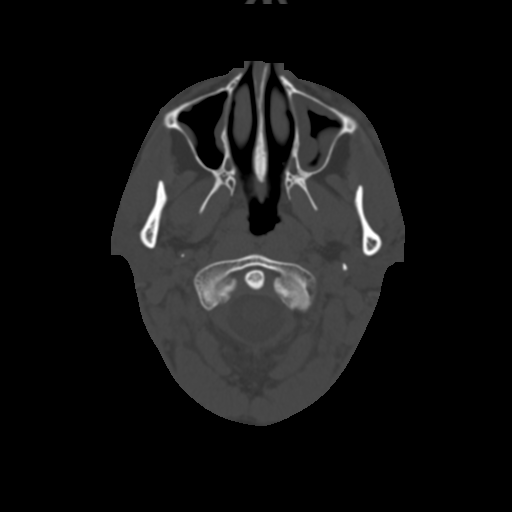
[im 6/29  bone]
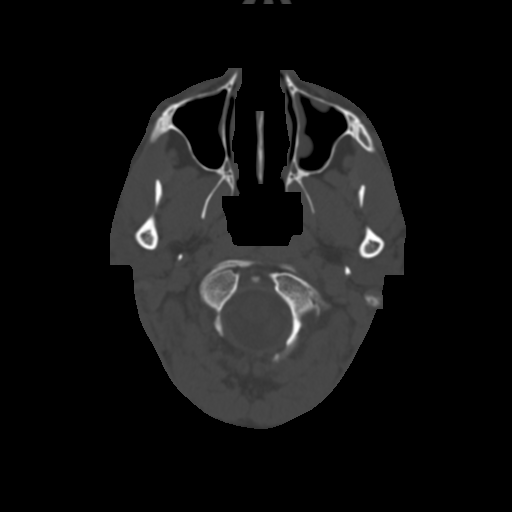
[im 8/29  bone]
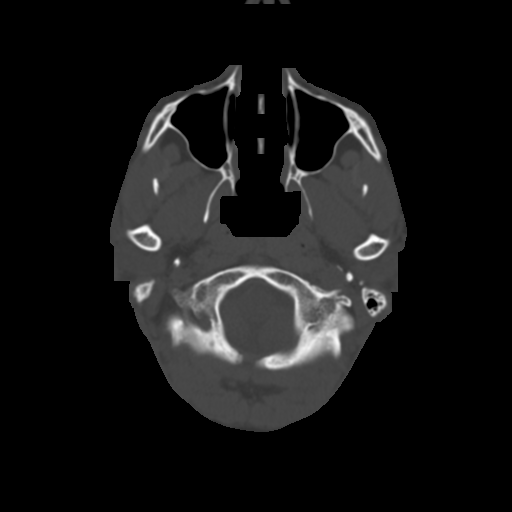
[im 10/29  brain]
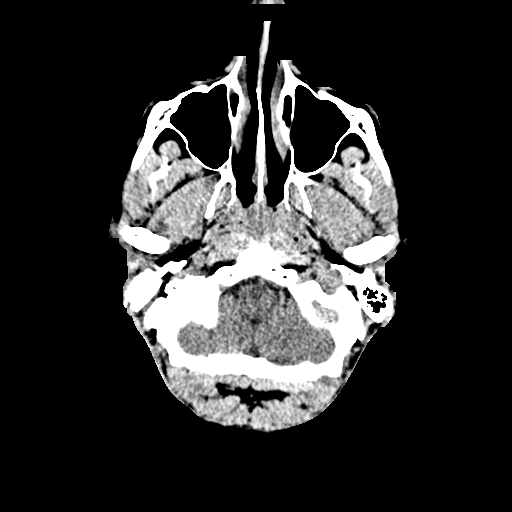
[im 10/29  bone]
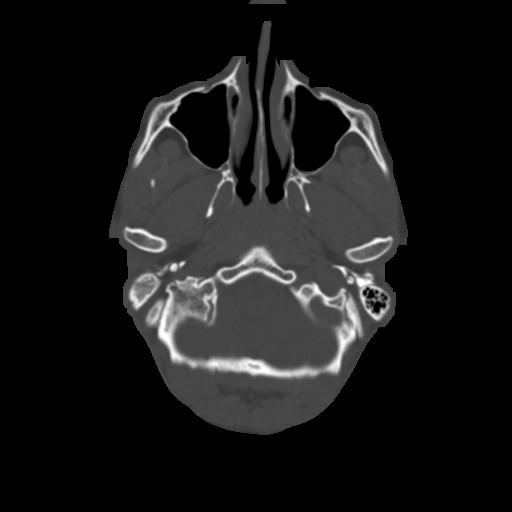
[im 11/29  bone]
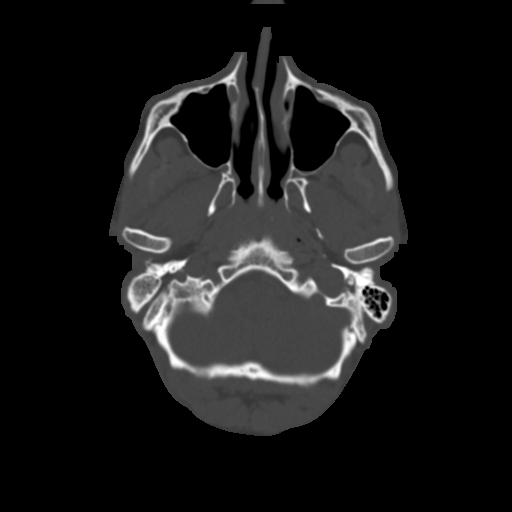
[im 13/29  bone]
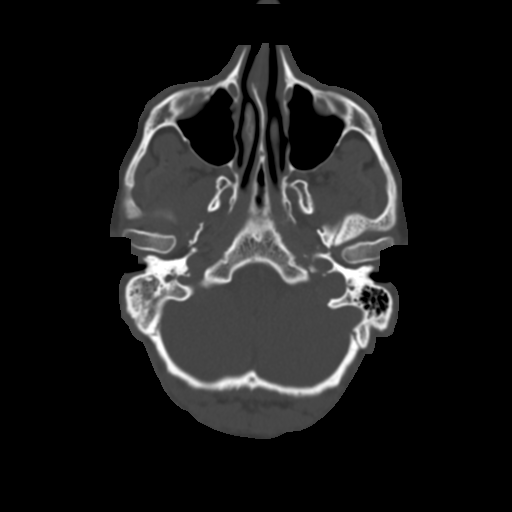
[im 15/29  bone]
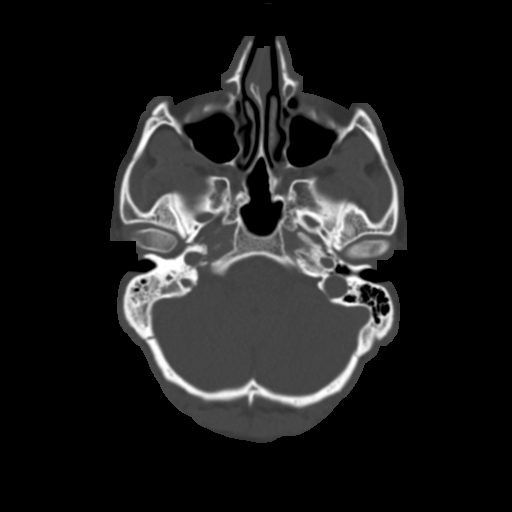
[im 17/29  brain]
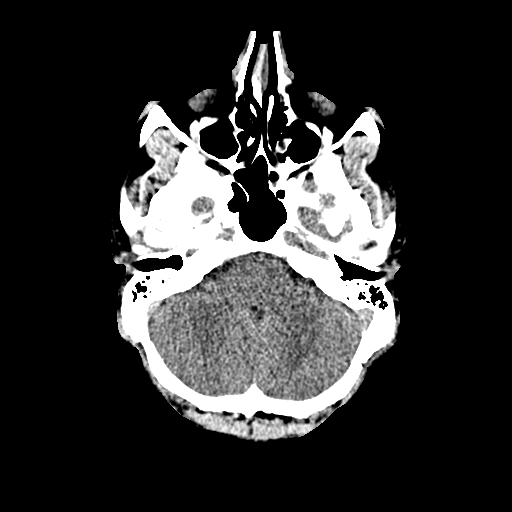
[im 17/29  bone]
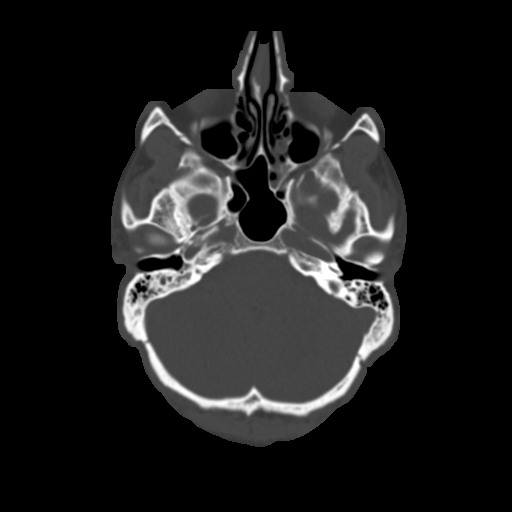
[im 19/29  bone]
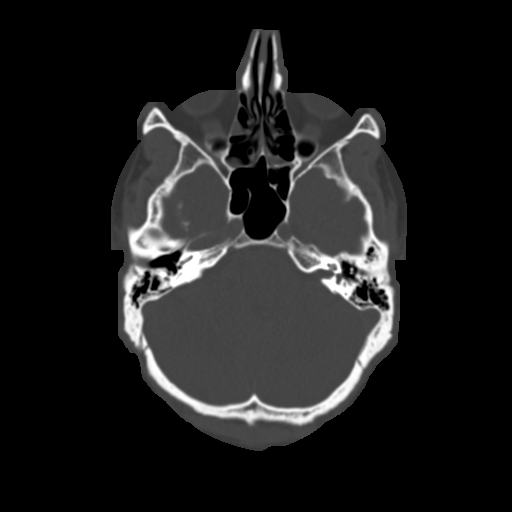
[im 20/29  bone]
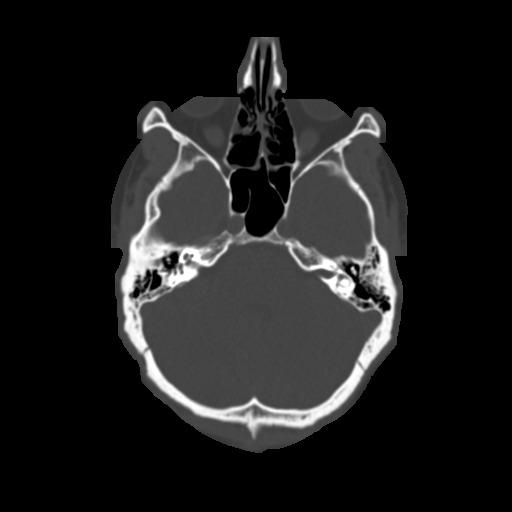
[im 22/29  bone]
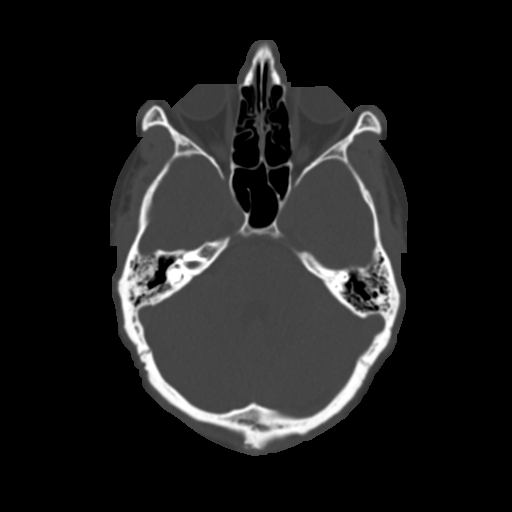
[im 24/29  brain]
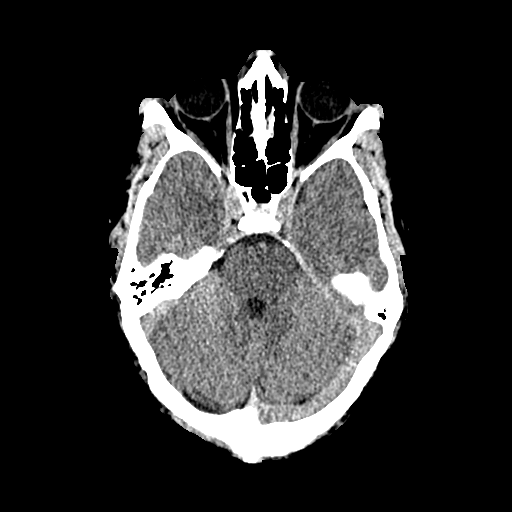
[im 24/29  bone]
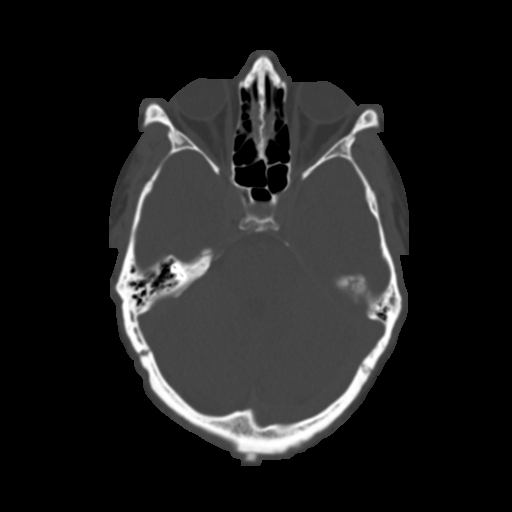
[im 26/29  bone]
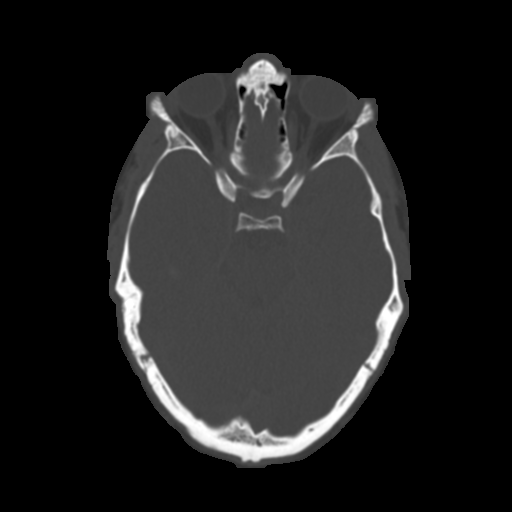
[im 28/29  bone]
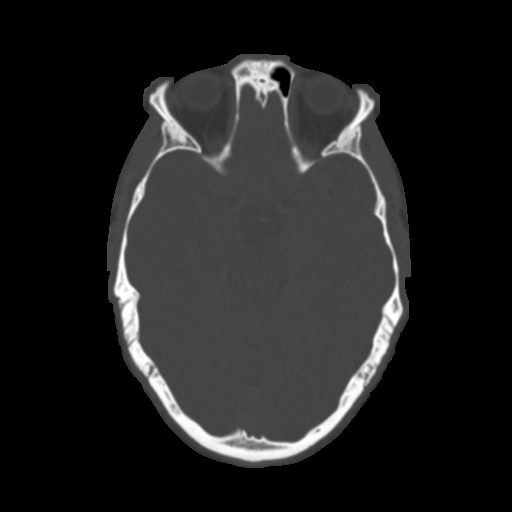

[15 of 29 positions shown; findings below may reference images not displayed]

FINDINGS: RIGHT ear: External canal widely patent. Retracted TM with
tympanostomy tube/defect inferiorly. No cholesteatoma or significant
middle ear fluid. No ossicular erosion for disruption. No
significant mastoid fluid or cholesteatoma. Tegmen tympani and
mastoideum are intact. Inner ear structures unremarkable.

LEFT ear: External canal widely patent. TM retracted, suspected
defect or tympanostomy tube inferiorly. Ossicles intact. No middle
ear fluid or cholesteatoma. Mastoid air cells clear. Tegmen tympani
and tegmen mastoideum intact. Inner ear structures unremarkable.

Intracranial compartment visualized portions negative.
IMPRESSION: No visible cholesteatoma or significant middle ear fluid. BILATERAL
tympanic membrane retraction.

BILATERAL tegmen tympani and mastoideum intact. No ossicular
disruption.

## 2017-11-26 ENCOUNTER — Encounter: Payer: Self-pay | Admitting: Family Medicine

## 2017-12-04 ENCOUNTER — Telehealth: Payer: Self-pay | Admitting: Internal Medicine

## 2017-12-04 ENCOUNTER — Encounter: Payer: Self-pay | Admitting: Internal Medicine

## 2017-12-04 NOTE — Telephone Encounter (Signed)
Pts mother states pt is having problems with vomiting now, states he is "cycling." Mother wants to know where she should take him for IV fluids if he needs them. D[facility] with her [facility]. She asked about home care, let her know unless he is considered home bound Insurance will not pay. She asked about wait times at the ER, discussed with her that she could call and ask them what the presumed wait time might be. She verbalized understanding.

## 2017-12-06 ENCOUNTER — Encounter: Payer: Self-pay | Admitting: Internal Medicine

## 2018-01-28 ENCOUNTER — Encounter: Payer: Self-pay | Admitting: Family Medicine

## 2018-02-11 ENCOUNTER — Telehealth: Payer: Self-pay | Admitting: *Deleted

## 2018-02-11 NOTE — Telephone Encounter (Signed)
ok 

## 2018-02-11 NOTE — Telephone Encounter (Signed)
Left message advising patient he is fine to keep upcoming appt with Dr Rhea BeltonPyrtle.

## 2018-02-11 NOTE — Telephone Encounter (Signed)
Left message for patient to call back  

## 2018-02-11 NOTE — Telephone Encounter (Signed)
-----   Message from Beverley FiedlerJay M Pyrtle, MD sent at 02/09/2018  9:19 PM EDT ----- Seems like he is being actively managed [facility] Alycia RossettiKoch agreed with diagnosis of CVS I am not opposed to seeing him, but I also recommend that he maintain his relationship with Dr. Alycia RossettiKoch as he is a nausea, vomiting and cyclic vomiting syndrome expert. JMP  ----- Message ----- From: Richardson ChiquitoSmith, Sacheen Arrasmith N, CMA Sent: 02/05/2018   4:31 PM EDT To: Beverley FiedlerJay M Pyrtle, MD  This patient is on your schedule for 02/26/18. It looks like since seeing you, he has seen Dr Alycia RossettiKoch multiple times. Is he still considered our patient?

## 2018-02-11 NOTE — Telephone Encounter (Signed)
I have advised patient that while Dr Rhea BeltonPyrtle still maintains his Dr/patient relationship and is not in any way saying he will not see him, Dr Rhea BeltonPyrtle would prefer patient continue to have management of his CVS by [facility] as he has been already. Dr Alycia RossettiKoch is world renowned in CVS and would best be maintained by him for this issue until released to local GI MD. However, after explaining this to patient, he indicates that he would still like to keep appointment with Dr Rhea BeltonPyrtle on 02/26/18 because Dr Alycia RossettiKoch has not been able to give much more insight regarding CVS than Dr Rhea BeltonPyrtle did an Dr Rhea BeltonPyrtle is much a more local source for him. Dr Rhea BeltonPyrtle, please advise... Okay with you?

## 2018-02-26 ENCOUNTER — Encounter: Payer: Self-pay | Admitting: Internal Medicine

## 2018-02-26 ENCOUNTER — Telehealth: Payer: Self-pay | Admitting: *Deleted

## 2018-02-26 ENCOUNTER — Ambulatory Visit (INDEPENDENT_AMBULATORY_CARE_PROVIDER_SITE_OTHER): Payer: Managed Care, Other (non HMO) | Admitting: Internal Medicine

## 2018-02-26 VITALS — BP 114/70 | HR 88 | Ht 68.0 in | Wt 180.2 lb

## 2018-02-26 DIAGNOSIS — G43A0 Cyclical vomiting, not intractable: Secondary | ICD-10-CM | POA: Diagnosis not present

## 2018-02-26 NOTE — Progress Notes (Signed)
Subjective:    Patient ID: Martin Terry, male    DOB: [birth year], 22 y.o.   MRN: [id number]  HPI Kirkland HunDaniel Russell is a 22 year old male with a past medical history of cyclic vomiting syndrome who is here for follow-up.  She he is here today with his mother.  He was last seen here at the time of upper endoscopy performed on 07/26/2017.  He has been seen by [facility] Claire [facility] since being seen here.  He had an unremarkable hydrogen breath test, gastric emptying study, and EGG.  He has been maintained on amitriptyline which was titrated up to 75 mg at bedtime.  He is taking co-Q10 200 mg twice daily, l-carnitine daily and ranitidine 75 mg in the morning.  He was given a prescription for Ativan but has not needed to use this.  This was given in the event of an onset of a vomiting attack to try to abort such an attack.  He reports for the last 3+ weeks he has had no issues with nausea or vomiting.  He about 3-1/2 weeks ago he had one episode of isolated vomiting which did not trigger a cycle.  Interestingly this was after eating pistachios which he then remembered had set off a cycle in November 2018.  He has been since avoiding all nuts though he has been able to tolerate peanut butter.  He had a cyclic vomiting attack starting around 12/05/2017 which lasted 7 days.  He used Phenergan throughout that attack but did require IV fluids in the emergency department.  No abdominal pain.  No report of change in bowel habit.  Review of Systems As per HPI, otherwise negative  Current Medications, Allergies, Past Medical History, Past Surgical History, Family History and Social History were reviewed in [facility] electronic medical record.     Objective:   Physical Exam BP 114/70 (BP Location: Left Arm, Patient Position: Sitting, Cuff Size: Normal)   Pulse 88   Ht 5\' 8"  (1.727 m)   Wt 180 lb 4 oz (81.8 kg)   BMI 27.41 kg/m  Constitutional: Well-developed and well-nourished. No distress. HEENT:  Normocephalic and atraumatic.  No scleral icterus. Neck: Neck supple. Trachea midline. Cardiovascular: Normal rate, regular rhythm and intact distal pulses. No M/R/G Pulmonary/chest: Effort normal and breath sounds normal. No wheezing, rales or rhonchi. Abdominal: Soft, nontender, nondistended. Bowel sounds active throughout. There are no masses palpable. No hepatosplenomegaly. Extremities: no clubbing, cyanosis, or edema Neurological: Alert and oriented to person place and time. Skin: Skin is warm and dry. Psychiatric: Normal mood and affect. Behavior is normal.  GES, HBT, [facility] and negative EGD reviewed from Jan 2019 - unremarkable     Assessment & Plan:  22 year old male with a past medical history of cyclic vomiting syndrome who is here for follow-up.  She he is here today with his mother.  1. [facility] --patient meets diagnostic criteria for cyclic vomiting syndrome and other possible etiologies have been excluded with adequate and thorough testing.  Currently he is doing well with current therapy.  We will continue amitriptyline 75 mg nightly as prophylaxis against vomiting cycles.  Continue co-Q10 for mitochondrial supplementation as well as l-carnitine.  Agree with the use of Lorazepam 1 mg as needed at initiation of vomiting episode to try to abort a full vomiting cycle.  He will also continue ranitidine 75 mg once daily but should increase to twice daily during an attack. --His mother asked about possible rehydration  options in the event of an attack as ER visits are often costly and are usually only beneficial for IV fluids.  There is a private company called rehydration station which will do home IV fluids in the event this is necessary.  He was given this information today. --He is waiting for an appointment with medical genetics to evaluate for mitochondrial disorders at [facility].  This appointment is not until sometime in mid 2020.  I will contact our genetics clinic here  locally to see if this testing could be available sooner. --Follow-up in 6 to 12 months, sooner if needed  25 minutes spent with the patient today. Greater than 50% was spent in counseling and coordination of care with the patient

## 2018-02-26 NOTE — Telephone Encounter (Signed)
-----   Message from Beverley FiedlerJay M Pyrtle, MD sent at 02/26/2018 10:45 AM EDT ----- Please contact medical genetics to see if they have the capability to perform genetic testing for mitochondrial disorders as a relates to cyclic vomiting syndrome He is currently waiting for an appointment [facility] but not until mid 2020 and they were wondering if this could be done locally and sooner.

## 2018-02-26 NOTE — Telephone Encounter (Signed)
I have contacted our genetics clinic at [phone]. Unfortunately, they have no ability to test for mitochondrial disorders. They have given the number for [facility], administrative assistant, Jackquline BerlinLatonya Williams. Phone [phone] that may be able to provide insight for us. I have left a voicemail advising patient that unfortunately, we are unable to provide testing in [facility] for his [facility]. He does have an appointment already in place at [facility] so this may be the most appropriate for him at this time. However, should this plan fall through, we can contact [facility].

## 2018-02-26 NOTE — Patient Instructions (Signed)
Continue all current medications.  Follow up with Dr Rhea BeltonPyrtle in 6-12 months.  If you are age 24 or older, your body mass index should be between 23-30. Your Body mass index is 27.41 kg/m. If this is out of the aforementioned range listed, please consider follow up with your Primary Care Provider.  If you are age 24 or younger, your body mass index should be between 19-25. Your Body mass index is 27.41 kg/m. If this is out of the aformentioned range listed, please consider follow up with your Primary Care Provider.

## 2018-03-27 ENCOUNTER — Encounter: Payer: Self-pay | Admitting: Family Medicine

## 2018-03-27 ENCOUNTER — Ambulatory Visit (INDEPENDENT_AMBULATORY_CARE_PROVIDER_SITE_OTHER): Payer: Managed Care, Other (non HMO) | Admitting: Family Medicine

## 2018-03-27 VITALS — BP 128/70 | HR 92 | Temp 98.9°F | Resp 16 | Ht 68.0 in | Wt 180.4 lb

## 2018-03-27 DIAGNOSIS — R109 Unspecified abdominal pain: Secondary | ICD-10-CM

## 2018-03-27 DIAGNOSIS — K909 Intestinal malabsorption, unspecified: Secondary | ICD-10-CM | POA: Diagnosis not present

## 2018-03-27 DIAGNOSIS — M67432 Ganglion, left wrist: Secondary | ICD-10-CM | POA: Diagnosis not present

## 2018-03-27 NOTE — Progress Notes (Signed)
OFFICE VISIT  03/27/2018   CC:  Chief Complaint  Patient presents with  . Bump    on wrist  . Labs    Pts mother and MGF were just Dx with Vit B def, he would like to be tested   HPI:    Patient is a 21 y.o.  male who presents for bump on L wrist. Noted 1 wk ago.  Hurts a little if pressed firmly.  No redness.  No injury to the area.  ROM of wrist and fingers is normal. Has never had this before.  His mother and maternal GF are B12 def. He does eat meat.  No tingling or numbness in hands or feet.  He also requests iron level testing.  He has hx of recurrent severe cyclic vomiting.    GF has crohn's.   Mom with prediabetes--unknown if on med or not.  Past Medical History:  Diagnosis Date  . Allergy   . Cyclical vomiting 2005   2005, again 2012.  Abd u/s NORMAL 04/2017.  GI (Dr. Rhea Belton) eval 07/2017: EGD done, gastric and duodenal bx's neg; pt started on amitriptyline 50mg  qhs, coQ10, and L carnitine.  2nd opinion with [facility] (Dr. Alycia Rossetti) and 4 hr gastr emptying study, electroesophagram, hydrog breath test: all results normal.  Amitrip inc to 75mg  qd, Ativan added as abortifascient--as of 01/08/18 [facility] F/u.  Marland Kitchen GERD (gastroesophageal reflux disease)    Celiac w/u neg, 2H GTT NORMAL (2016)  . History of heart murmur in childhood    Age 29; eval by cardiologist for a few episodes of mildly elevated bp,  This cardiac eval was normal, including echo and EKG (2012).  . Tympanic membrane perforation, right 2019   +conductive hearing loss-->watchful waiting approach as per ENT f/u visit 10/2017.    Past Surgical History:  Procedure Laterality Date  . ADENOIDECTOMY  2000  . ESOPHAGOGASTRODUODENOSCOPY  07/26/2017   Gastric and duodenal biopsies normal.   . TYMPANOSTOMY TUBE PLACEMENT     with hx of TM perf on R as of 08/2016 f/u with Dr. Jenne Pane.  Hx of conductive hearing loss.  . WISDOM TOOTH EXTRACTION      Outpatient Medications Prior to Visit  Medication Sig Dispense  Refill  . amitriptyline (ELAVIL) 75 MG tablet Take 75 mg by mouth daily.  1  . Coenzyme Q-10 200 MG CAPS Take 1 capsule by mouth 2 (two) times daily.     Marland Kitchen levOCARNitine Fumarate 500 MG TABS Take 1 tablet by mouth daily.     Marland Kitchen LORazepam (ATIVAN) 1 MG tablet Take 1 mg by mouth as needed.   0  . ranitidine (ZANTAC 75) 75 MG tablet Take 1 tablet (75 mg total) by mouth 2 (two) times daily. 60 tablet 2  . 0.9 %  sodium chloride infusion      No facility-administered medications prior to visit.     Allergies  Allergen Reactions  . Haldol [Haloperidol Lactate] Other (See Comments)    Agitation  . Zofran [Ondansetron Hcl]     Other reaction(s): Other (See Comments) unknown    ROS As per HPI  PE: Blood pressure 128/70, pulse 92, temperature 98.9 F (37.2 C), temperature source Oral, resp. rate 16, height 5\' 8"  (1.727 m), weight 180 lb 6 oz (81.8 kg), SpO2 100 %. Gen: Alert, well appearing.  Patient is oriented to person, place, time, and situation. AFFECT: pleasant, lucid thought and speech. Left wrist volar aspect with bee bee sized firm  nodule that moves coincidentally with thumb ROM. No tenderness.  ROM of thumb, fingers, wrist intact.  No erythema.  LABS:   Lab Results  Component Value Date   WBC 5.1 03/27/2017   HGB 15.4 03/27/2017   HCT 45.3 03/27/2017   MCV 87.3 03/27/2017   PLT 248.0 03/27/2017      Chemistry      Component Value Date/Time   NA 139 03/27/2017 1526   K 4.3 03/27/2017 1526   CL 103 03/27/2017 1526   CO2 30 03/27/2017 1526   BUN 17 03/27/2017 1526   CREATININE 1.22 03/27/2017 1526      Component Value Date/Time   CALCIUM 9.9 03/27/2017 1526   ALKPHOS 47 03/27/2017 1526   AST 16 03/27/2017 1526   ALT 20 03/27/2017 1526   BILITOT 0.5 03/27/2017 1526      IMPRESSION AND PLAN:  1) Ganglion cyst of volar aspect of L wrist: reassured pt of benign nature of this lesion. Watchful waiting recommended.  Expect spontaneous resolution.  2) Recurrent  vomiting/possible recurrent gastritis---potential for malabsorption of iron and vit B12, so I will check vit B12 and iron panel as per pt request today.  See orders.  An After Visit Summary was printed and given to the patient.  FOLLOW UP: Return for as needed.  Signed:  Santiago Bumpers, MD           03/27/2018

## 2018-03-28 ENCOUNTER — Other Ambulatory Visit: Payer: Self-pay | Admitting: Family Medicine

## 2018-03-28 ENCOUNTER — Encounter: Payer: Self-pay | Admitting: Family Medicine

## 2018-03-28 DIAGNOSIS — E538 Deficiency of other specified B group vitamins: Secondary | ICD-10-CM

## 2018-03-28 HISTORY — DX: Deficiency of other specified B group vitamins: E53.8

## 2018-03-28 LAB — IRON: Iron: 118 ug/dL (ref 42–165)

## 2018-03-28 LAB — FERRITIN: Ferritin: 140.4 ng/mL (ref 22.0–322.0)

## 2018-03-28 LAB — IRON AND TIBC
IRON SATURATION: 39 % (ref 15–55)
Iron: 109 ug/dL (ref 38–169)
TIBC: 280 ug/dL (ref 250–450)
UIBC: 171 ug/dL (ref 111–343)

## 2018-03-28 LAB — VITAMIN B12: Vitamin B-12: 185 pg/mL — ABNORMAL LOW (ref 211–911)

## 2018-03-28 MED ORDER — CYANOCOBALAMIN 1000 MCG/ML IJ SOLN: INTRAMUSCULAR | 0 refills | Status: DC

## 2018-03-31 ENCOUNTER — Ambulatory Visit (INDEPENDENT_AMBULATORY_CARE_PROVIDER_SITE_OTHER): Payer: Managed Care, Other (non HMO) | Admitting: *Deleted

## 2018-03-31 ENCOUNTER — Other Ambulatory Visit: Payer: Self-pay | Admitting: Family Medicine

## 2018-03-31 ENCOUNTER — Other Ambulatory Visit: Payer: Self-pay | Admitting: *Deleted

## 2018-03-31 DIAGNOSIS — E538 Deficiency of other specified B group vitamins: Secondary | ICD-10-CM

## 2018-03-31 MED ORDER — CYANOCOBALAMIN 1000 MCG/ML IJ SOLN
1000.0000 ug | INTRAMUSCULAR | Status: AC
  Administered 2018-04-11: 1000 ug via INTRAMUSCULAR

## 2018-03-31 MED ORDER — CYANOCOBALAMIN 1000 MCG/ML IJ SOLN
1000.0000 ug | Freq: Every day | INTRAMUSCULAR | Status: AC
  Administered 2018-03-31 – 2018-04-04 (×5): 1000 ug via INTRAMUSCULAR

## 2018-03-31 MED ORDER — CYANOCOBALAMIN 1000 MCG/ML IJ SOLN: INTRAMUSCULAR | 0 refills | Status: DC

## 2018-03-31 NOTE — Progress Notes (Signed)
Patient presents today for B12 injection #1 of 5 daily injections as ordered by Dr Milinda Cave. Patient tolerated injection well. Patient given written Rx for b12 today medication was clinic supplied.

## 2018-03-31 NOTE — Progress Notes (Signed)
Pt with vit B12 deficiency.  Agree with vit B12 1000 mcg IM in office today. Signed:  Phil McGowen, MD           03/31/2018  

## 2018-04-01 ENCOUNTER — Ambulatory Visit (INDEPENDENT_AMBULATORY_CARE_PROVIDER_SITE_OTHER): Payer: Managed Care, Other (non HMO) | Admitting: *Deleted

## 2018-04-01 DIAGNOSIS — E538 Deficiency of other specified B group vitamins: Secondary | ICD-10-CM | POA: Diagnosis not present

## 2018-04-01 NOTE — Progress Notes (Signed)
Martin Terry is a 22 y.o. male presents to the office today for Vitamin B12 injection, per physician's orders. Original order: 03/27/18 (see lab note) cyanocobalamin, 1000 mcg/mL, IM was administered right deltoid today. Patient tolerated injection. Patient due for follow up labs/provider appt: No. Date due: N/A, appt made N/A Patient next injection due: 04/02/2018, appt made Yes  Pryor Ochoa

## 2018-04-02 ENCOUNTER — Ambulatory Visit (INDEPENDENT_AMBULATORY_CARE_PROVIDER_SITE_OTHER): Payer: Managed Care, Other (non HMO) | Admitting: *Deleted

## 2018-04-02 DIAGNOSIS — E538 Deficiency of other specified B group vitamins: Secondary | ICD-10-CM | POA: Diagnosis not present

## 2018-04-02 NOTE — Progress Notes (Signed)
Patient presents today for b 12 injection 3 of 5 daily injections. Patient tolerated well.

## 2018-04-03 ENCOUNTER — Ambulatory Visit: Payer: Managed Care, Other (non HMO)

## 2018-04-03 ENCOUNTER — Ambulatory Visit (INDEPENDENT_AMBULATORY_CARE_PROVIDER_SITE_OTHER): Payer: Managed Care, Other (non HMO) | Admitting: *Deleted

## 2018-04-03 DIAGNOSIS — E538 Deficiency of other specified B group vitamins: Secondary | ICD-10-CM

## 2018-04-03 NOTE — Progress Notes (Addendum)
Martin Terry is a 22 y.o. male presents to the office today for 4th vitamin B12 injection, per physician's orders. Original order: 03/28/18 Cyanocobalamin, 1075mcg/mL, IM was administered right deltoid today.  Pt tolerated injection well, given without incident or problem. Patient left without complaint.  Patient due for follow up labs/provider appt: No Date due: 05/2018 , appt made no. Patient next injection due: 04/04/18, appt made Yes  Pryor Ochoa   Medical screening examination/treatment/procedure(s) were performed by non-physician practitioner and as supervising physician I was immediately available for consultation/collaboration.  I agree with above assessment and plan.  Electronically Signed by: Felix Pacini, DO [facility]

## 2018-04-03 NOTE — Progress Notes (Signed)
Pt with vit B12 deficiency.  Agree with vit B12 1000 mcg IM in office today. Signed:  Santiago Bumpers, MD           04/03/2018

## 2018-04-04 ENCOUNTER — Ambulatory Visit (INDEPENDENT_AMBULATORY_CARE_PROVIDER_SITE_OTHER): Payer: Managed Care, Other (non HMO) | Admitting: Family Medicine

## 2018-04-04 DIAGNOSIS — E538 Deficiency of other specified B group vitamins: Secondary | ICD-10-CM | POA: Diagnosis not present

## 2018-04-04 NOTE — Progress Notes (Signed)
Patient presented to office today for last daily B12 injection.  Injection tolerated well.  Next appointment is 04/11/2018 for start of weekly injection.

## 2018-04-11 ENCOUNTER — Ambulatory Visit (INDEPENDENT_AMBULATORY_CARE_PROVIDER_SITE_OTHER): Payer: Managed Care, Other (non HMO) | Admitting: *Deleted

## 2018-04-11 DIAGNOSIS — E538 Deficiency of other specified B group vitamins: Secondary | ICD-10-CM | POA: Diagnosis not present

## 2018-04-11 NOTE — Progress Notes (Signed)
Martin Terry is a 22 y.o. male presents to the office today for his 2:4 weekly vitamin B12 injection, per physician's orders. Original order: 03/28/18 Cyanocobalamin, 1035mcg/mL, IM was administered right deltoid today.  Pt tolerated injection well, given without incident or problem. Patient left without complaint. Patient due for follow up labs/provider appt: No Date due: 05/2018 , appt made no. Patient next injection due: 04/18/18, appt made Yes  Pryor Ochoa

## 2018-04-18 ENCOUNTER — Ambulatory Visit (INDEPENDENT_AMBULATORY_CARE_PROVIDER_SITE_OTHER): Payer: Managed Care, Other (non HMO)

## 2018-04-18 DIAGNOSIS — E538 Deficiency of other specified B group vitamins: Secondary | ICD-10-CM | POA: Diagnosis not present

## 2018-04-18 MED ORDER — CYANOCOBALAMIN 1000 MCG/ML IJ SOLN
1000.0000 ug | Freq: Once | INTRAMUSCULAR | Status: AC
  Administered 2018-04-18: 1000 ug via INTRAMUSCULAR

## 2018-04-18 NOTE — Progress Notes (Signed)
Vitamin B12 injection given with no incidence or problems. #3 of #4 weekly injection. Patient left with no complaints. Original order 03/28/18. Last injection 04/11/18. Next injection due 04/25/18.

## 2018-04-20 NOTE — Progress Notes (Signed)
Pt with vit B12 deficiency.  Agree with vit B12 1000 mcg IM in office today. Signed:  Santiago Bumpers, MD           04/20/2018

## 2018-04-20 NOTE — Progress Notes (Signed)
Pt with vit B12 deficiency.  Agree with vit B12 1000 mcg IM in office today. Signed:  Phil Vint Pola, MD           04/20/2018  

## 2018-04-20 NOTE — Progress Notes (Signed)
Pt with vit B12 deficiency.  Agree with vit B12 1000 mcg IM in office today. Signed:  Phil McGowen, MD           04/20/2018  

## 2018-04-25 ENCOUNTER — Other Ambulatory Visit: Payer: Self-pay | Admitting: *Deleted

## 2018-04-25 ENCOUNTER — Ambulatory Visit (INDEPENDENT_AMBULATORY_CARE_PROVIDER_SITE_OTHER): Payer: Managed Care, Other (non HMO) | Admitting: Family Medicine

## 2018-04-25 DIAGNOSIS — E538 Deficiency of other specified B group vitamins: Secondary | ICD-10-CM

## 2018-04-25 MED ORDER — CYANOCOBALAMIN 1000 MCG/ML IJ SOLN
1000.0000 ug | Freq: Once | INTRAMUSCULAR | Status: AC
  Administered 2018-04-25: 1000 ug via INTRAMUSCULAR

## 2018-04-25 NOTE — Progress Notes (Signed)
Patient presented to office today for B12 injection.  # 4 of weekly injections, patient tolerated well.  Patient will now be due for labs in 2 weeks to recheck B12 levels.

## 2018-04-26 ENCOUNTER — Other Ambulatory Visit: Payer: Self-pay | Admitting: Family Medicine

## 2018-04-29 NOTE — Progress Notes (Signed)
Pt with vit B12 deficiency.  Agree with vit B12 1000 mcg IM in office today. Signed:  Santiago Bumpers, MD           04/29/2018

## 2018-05-05 ENCOUNTER — Other Ambulatory Visit: Payer: Self-pay

## 2018-05-09 ENCOUNTER — Other Ambulatory Visit (INDEPENDENT_AMBULATORY_CARE_PROVIDER_SITE_OTHER): Payer: Managed Care, Other (non HMO)

## 2018-05-09 ENCOUNTER — Encounter: Payer: Self-pay | Admitting: Family Medicine

## 2018-05-09 ENCOUNTER — Other Ambulatory Visit: Payer: Self-pay | Admitting: Family Medicine

## 2018-05-09 DIAGNOSIS — E538 Deficiency of other specified B group vitamins: Secondary | ICD-10-CM

## 2018-05-09 LAB — VITAMIN B12: VITAMIN B 12: 348 pg/mL (ref 211–911)

## 2018-05-09 MED ORDER — CYANOCOBALAMIN 1000 MCG/ML IJ SOLN: INTRAMUSCULAR | 1 refills | Status: DC

## 2018-05-12 ENCOUNTER — Encounter: Payer: Self-pay | Admitting: *Deleted

## 2018-05-12 DIAGNOSIS — E538 Deficiency of other specified B group vitamins: Secondary | ICD-10-CM

## 2018-05-13 NOTE — Telephone Encounter (Signed)
Please advise. Thanks.  

## 2018-05-13 NOTE — Telephone Encounter (Signed)
Sorry, my mistake. OK to switch to oral b12: take one 1000 microgram tab daily. Lab visit to recheck vit B12 level in 3 months--order is in.

## 2018-06-16 ENCOUNTER — Other Ambulatory Visit: Payer: Self-pay

## 2018-06-16 MED ORDER — AMITRIPTYLINE HCL 50 MG PO TABS: 50.0000 mg | ORAL_TABLET | Freq: Every day | ORAL | 3 refills | Status: DC

## 2018-06-16 NOTE — Telephone Encounter (Signed)
Thank patient for the email and explanation Glad he is doing well Ok for refill amitriptyline 50 mg qhs.  Can slowly be reduced to off if no episodes of [facility]

## 2018-07-12 ENCOUNTER — Other Ambulatory Visit: Payer: Self-pay | Admitting: Internal Medicine

## 2018-08-12 ENCOUNTER — Other Ambulatory Visit (INDEPENDENT_AMBULATORY_CARE_PROVIDER_SITE_OTHER): Payer: BLUE CROSS/BLUE SHIELD

## 2018-08-12 ENCOUNTER — Encounter: Payer: Self-pay | Admitting: Family Medicine

## 2018-08-12 DIAGNOSIS — E538 Deficiency of other specified B group vitamins: Secondary | ICD-10-CM

## 2018-08-12 LAB — VITAMIN B12: Vitamin B-12: 556 pg/mL (ref 211–911)

## 2018-08-13 ENCOUNTER — Encounter: Payer: Self-pay | Admitting: *Deleted

## 2018-09-11 IMAGING — US US ABDOMEN COMPLETE
1 series · 13 of 25 positions shown · non-contrast
Comparison: None in PACs

CLINICAL DATA: Episodes of postprandial abdominal pain associated
with nausea and increased gas.

EXAM:
ABDOMEN ULTRASOUND COMPLETE

[Series 1: us abdomen complete · 0.22mm/px · 13 of 106 slices shown]
[im 1/106]
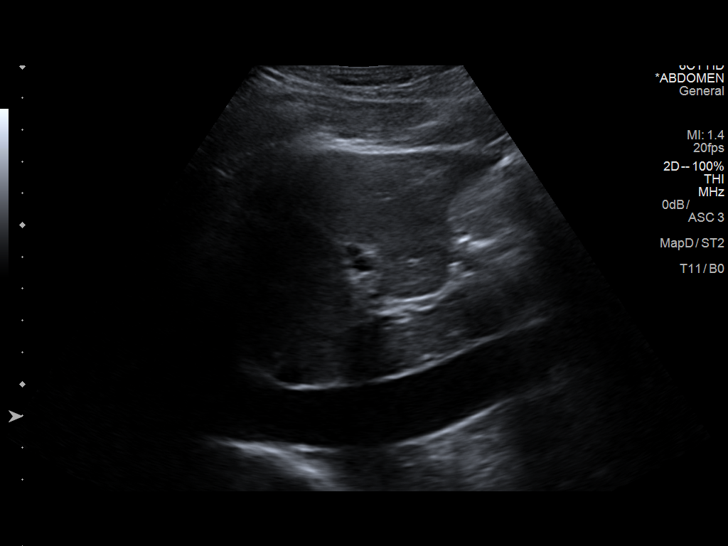
[im 9/106]
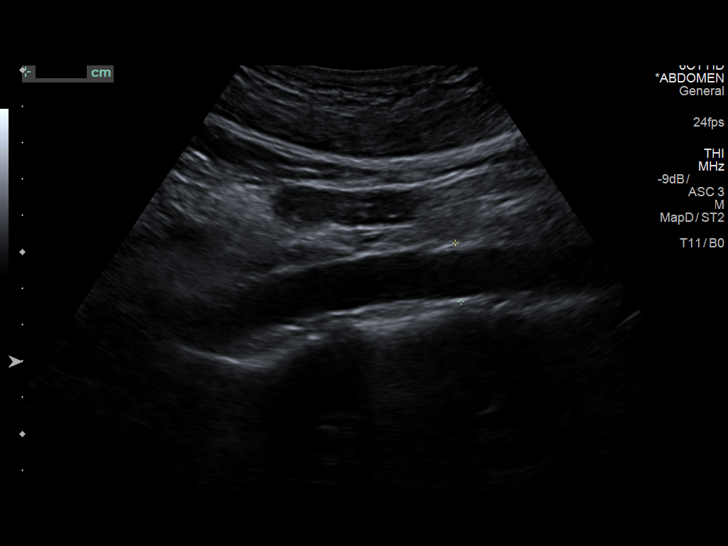
[im 18/106]
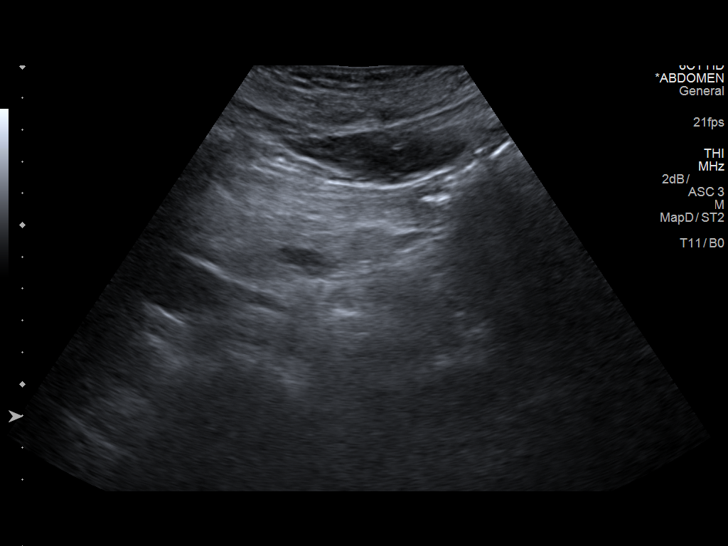
[im 27/106]
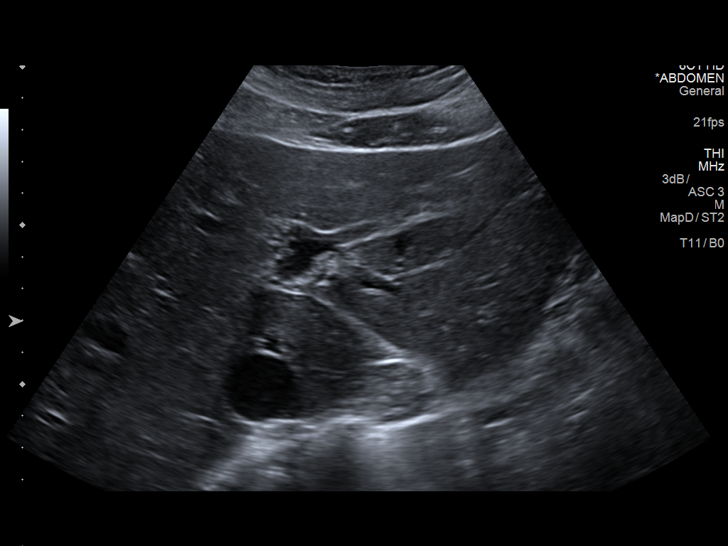
[im 36/106]
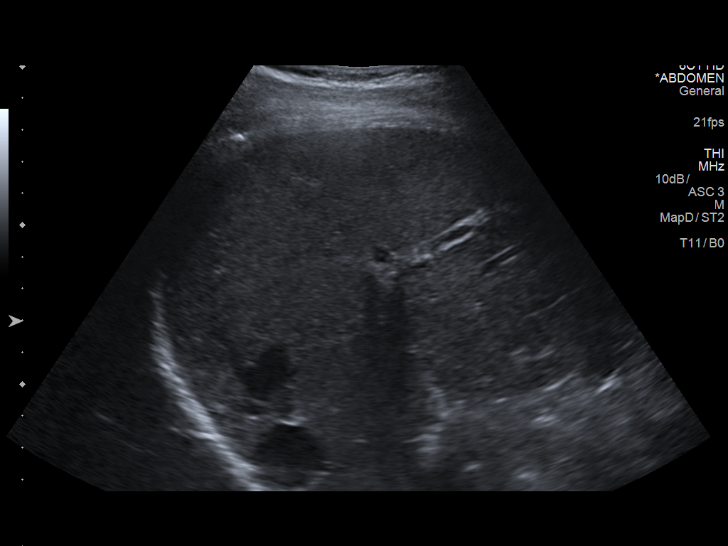
[im 44/106]
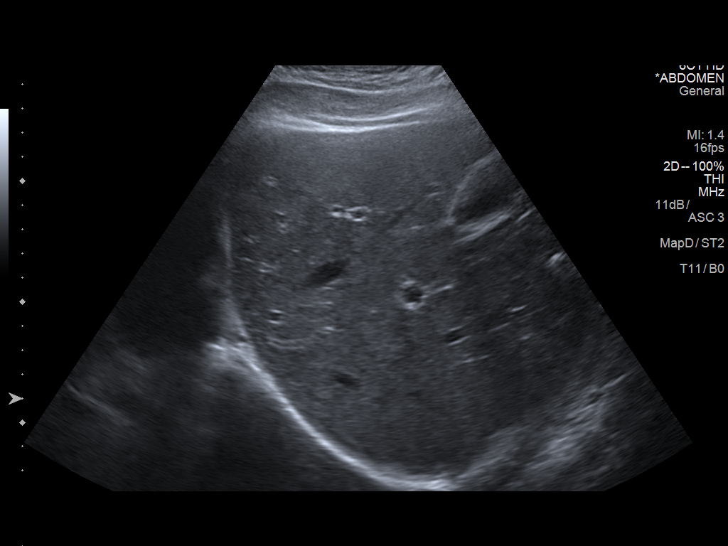
[im 53/106]
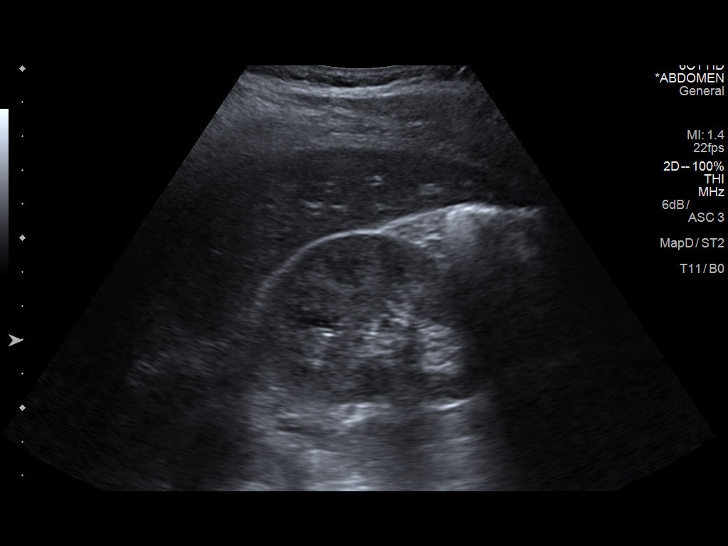
[im 62/106]
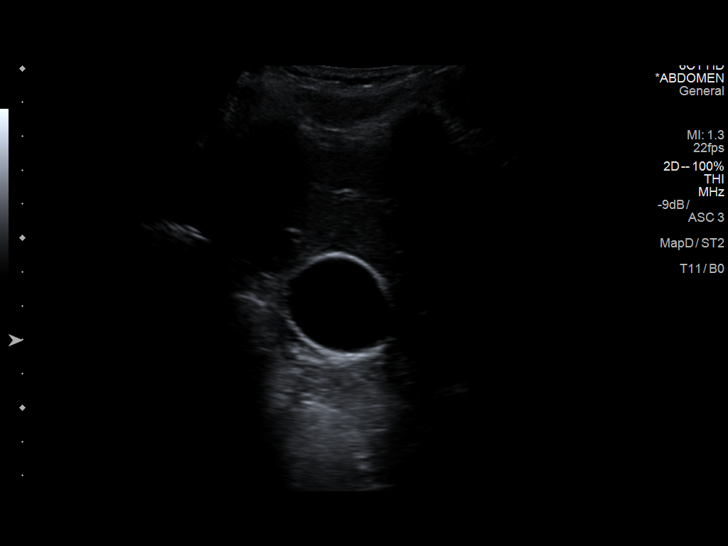
[im 71/106]
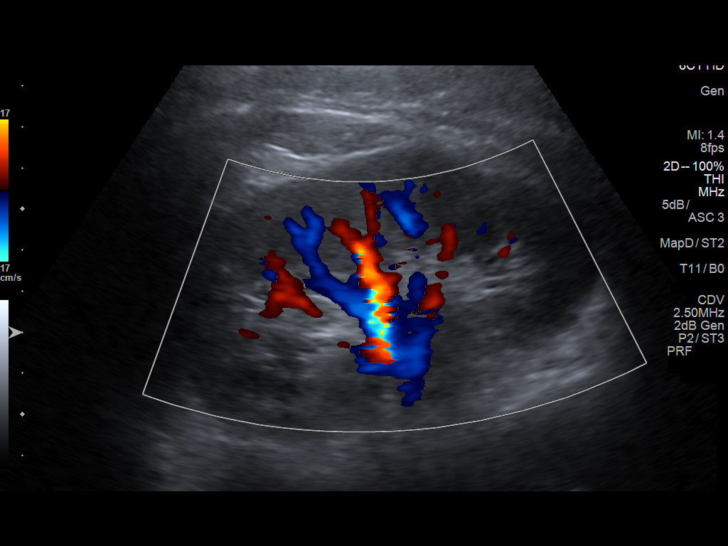
[im 79/106]
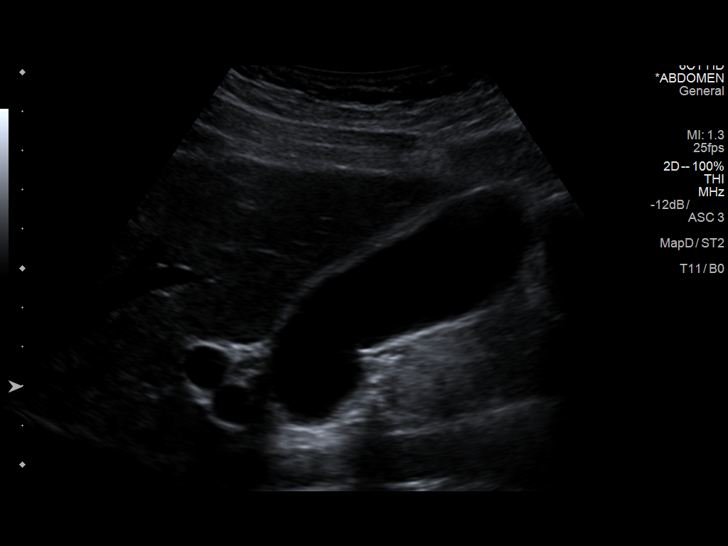
[im 88/106]
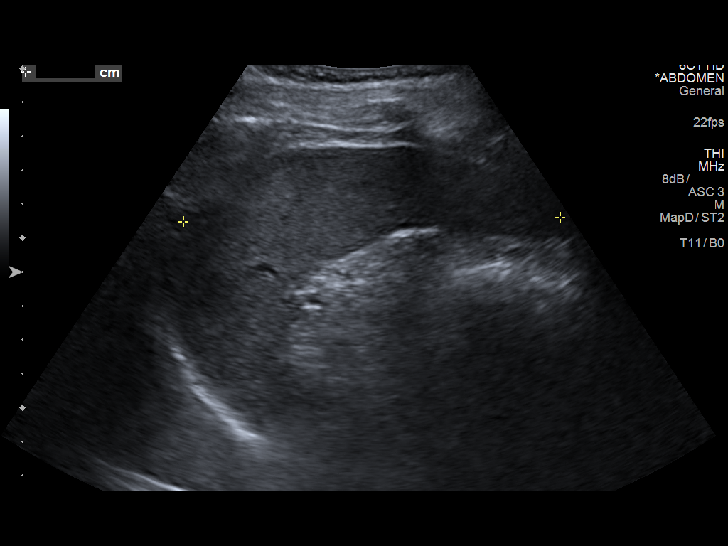
[im 97/106]
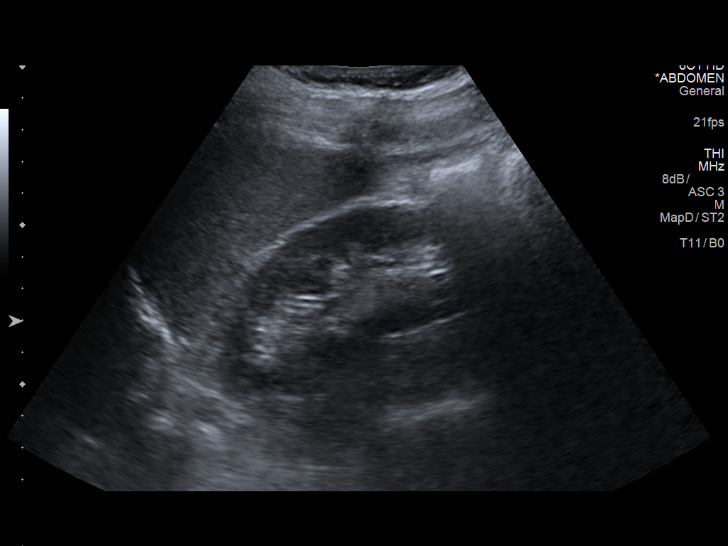
[im 106/106]
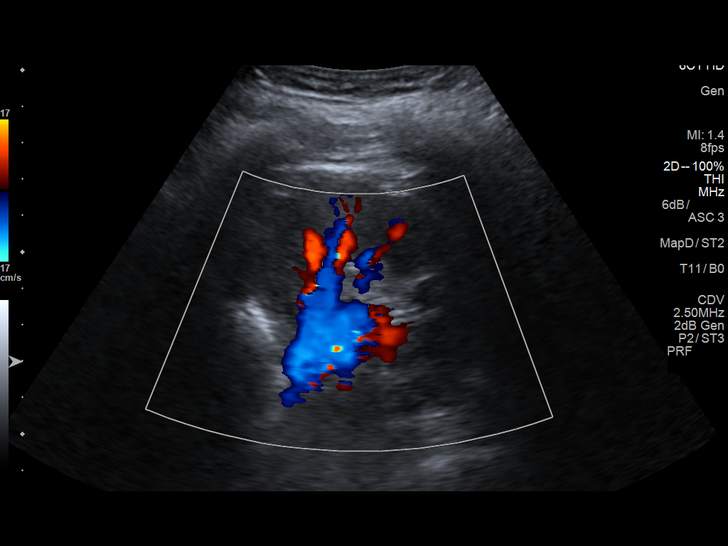

[13 of 25 positions shown; findings below may reference images not displayed]

FINDINGS: Gallbladder: No gallstones or wall thickening visualized. No
sonographic Murphy sign noted by sonographer.

Common bile duct: Diameter: 2.7 mm

Liver: No focal lesion identified. Within normal limits in
parenchymal echogenicity. Portal vein is patent on color Doppler
imaging with normal direction of blood flow towards the liver.

IVC: No abnormality visualized.

Pancreas: The pancreatic body appears normal. The pancreatic head
and tail are obscured by bowel gas.

Spleen: Size and appearance within normal limits.

Right Kidney: Length: 9.8 cm. Echogenicity within normal limits. No
mass or hydronephrosis visualized.

Left Kidney: Length: 11.2 cm. Echogenicity within normal limits. No
mass or hydronephrosis visualized.

Abdominal aorta: No aneurysm visualized.

Other findings: There is no ascites.
IMPRESSION: No gallstones or sonographic evidence of acute cholecystitis. If
there are clinical concerns of chronic gallbladder dysfunction, a
nuclear medicine hepatobiliary scan with gallbladder ejection
fraction determination may be useful.

No abnormality observed elsewhere within the abdomen.

## 2018-09-22 ENCOUNTER — Other Ambulatory Visit: Payer: Self-pay | Admitting: *Deleted

## 2018-09-22 MED ORDER — AMITRIPTYLINE HCL 50 MG PO TABS: ORAL_TABLET | ORAL | 0 refills | Status: DC

## 2018-10-09 ENCOUNTER — Other Ambulatory Visit: Payer: Self-pay | Admitting: Internal Medicine

## 2018-10-09 ENCOUNTER — Other Ambulatory Visit: Payer: Self-pay

## 2018-10-09 MED ORDER — AMITRIPTYLINE HCL 50 MG PO TABS: ORAL_TABLET | ORAL | 0 refills | Status: DC

## 2019-01-06 ENCOUNTER — Other Ambulatory Visit: Payer: Self-pay | Admitting: Internal Medicine

## 2019-02-14 ENCOUNTER — Encounter: Payer: Self-pay | Admitting: Family Medicine

## 2019-02-16 ENCOUNTER — Ambulatory Visit (INDEPENDENT_AMBULATORY_CARE_PROVIDER_SITE_OTHER): Payer: BC Managed Care – PPO | Admitting: Family Medicine

## 2019-02-16 ENCOUNTER — Encounter: Payer: Self-pay | Admitting: Family Medicine

## 2019-02-16 ENCOUNTER — Other Ambulatory Visit: Payer: Self-pay

## 2019-02-16 VITALS — BP 130/68 | HR 104 | Temp 98.6°F | Resp 17 | Ht 69.0 in | Wt 180.4 lb

## 2019-02-16 DIAGNOSIS — H65111 Acute and subacute allergic otitis media (mucoid) (sanguinous) (serous), right ear: Secondary | ICD-10-CM

## 2019-02-16 DIAGNOSIS — H66014 Acute suppurative otitis media with spontaneous rupture of ear drum, recurrent, right ear: Secondary | ICD-10-CM | POA: Diagnosis not present

## 2019-02-16 MED ORDER — AMOXICILLIN-POT CLAVULANATE 875-125 MG PO TABS: 1.0000 | ORAL_TABLET | Freq: Two times a day (BID) | ORAL | 1 refills | Status: AC

## 2019-02-16 MED ORDER — CIPROFLOXACIN-DEXAMETHASONE 0.3-0.1 % OT SUSP: 4.0000 [drp] | Freq: Two times a day (BID) | OTIC | 1 refills | Status: DC

## 2019-02-16 NOTE — Progress Notes (Signed)
OFFICE VISIT  02/16/2019   CC:  Chief Complaint  Patient presents with  . Ear Pain    right ear feels full. using OTC ear drops. x3 days. draining and pain. no fever.  went swimming last weekend.   HPI:    Patient is a 23 y.o.  male who presents for "possible ear infection". Hx of chronic right TM perf, watchful waiting approach as per 10/2017 ENT f/u.  Right ear feels full, has pain x several days, has drainage.  Went swimming recently, prior to onset of current sx's.  No fever or URI sx's. Taking ciprodex ear drops last 2d, has improved.  Past Medical History:  Diagnosis Date  . Allergy   . Cyclical vomiting 2005   2005, again 2012.  Abd u/s NORMAL 04/2017.  GI (Dr. Rhea BeltonPyrtle) eval 07/2017: EGD done, gastric and duodenal bx's neg; pt started on amitriptyline 50mg  qhs, coQ10, and L carnitine.  2nd opinion with [facility] (Dr. Alycia RossettiKoch) and 4 hr gastr emptying study, electroesophagram, hydrog breath test: all results normal.  Amitrip inc to 75mg  qd, Ativan added as abortifascient--as of 01/08/18 [facility] F/u.  Marland Kitchen. GERD (gastroesophageal reflux disease)    Celiac w/u neg, 2H GTT NORMAL (2016)  . History of heart murmur in childhood    Age 89; eval by cardiologist for a few episodes of mildly elevated bp,  This cardiac eval was normal, including echo and EKG (2012).  . Tympanic membrane perforation, right 2019   +conductive hearing loss-->watchful waiting approach as per ENT f/u visit 10/2017.  . Vitamin B12 deficiency 03/28/2018   IM replacement regimen done, then vit B12 1000mcg q 2wks briefly, then switched over to oral vit b12 05/2018-->f/u B12 level normal 08/2018.    Past Surgical History:  Procedure Laterality Date  . ADENOIDECTOMY  2000  . ESOPHAGOGASTRODUODENOSCOPY  07/26/2017   Gastric and duodenal biopsies normal.   . TYMPANOSTOMY TUBE PLACEMENT     with hx of TM perf on R as of 08/2016 f/u with Dr. Jenne PaneBates.  Hx of conductive hearing loss.  . WISDOM TOOTH EXTRACTION       Outpatient Medications Prior to Visit  Medication Sig Dispense Refill  . amitriptyline (ELAVIL) 25 MG tablet Take 1 tablet (25 mg total) by mouth at bedtime. MUST HAVE OFFICE VISIT FOR FURTHER REFILLS 90 tablet 0  . Coenzyme Q-10 200 MG CAPS Take 1 capsule by mouth 2 (two) times daily.     Marland Kitchen. levOCARNitine Fumarate 500 MG TABS Take 1 tablet by mouth daily.     Marland Kitchen. LORazepam (ATIVAN) 1 MG tablet Take 1 mg by mouth as needed.   0  . vitamin B-12 (CYANOCOBALAMIN) 1000 MCG tablet     . ciprofloxacin-dexamethasone (CIPRODEX) OTIC suspension     . cyanocobalamin (,VITAMIN B-12,) 1000 MCG/ML injection 1 ml IM q 2 weeks 10 mL 1  . ranitidine (ZANTAC 75) 75 MG tablet Take 1 tablet (75 mg total) by mouth 2 (two) times daily. 60 tablet 2   No facility-administered medications prior to visit.     Allergies  Allergen Reactions  . Haldol [Haloperidol Lactate] Other (See Comments)    Agitation  . Zofran [Ondansetron Hcl]     Other reaction(s): Other (See Comments) unknown    ROS As per HPI  PE: Blood pressure 130/68, pulse (!) 104, temperature 98.6 F (37 C), temperature source Temporal, resp. rate 17, height 5\' 9"  (1.753 m), weight 180 lb 6 oz (81.8 kg), SpO2 100 %. Gen:  Alert, well appearing.  Patient is oriented to person, place, time, and situation. AFFECT: pleasant, lucid thought and speech. LEFT EAC and TM normal. Right EAC with trace erythema but no swelling.  Moisture in EAC noted but no debris. R TM with two very small perf's at inferior-most aspect of TM, with erythematous TM with loss of landmarks, loss of light reflex.    LABS:    Chemistry      Component Value Date/Time   NA 139 03/27/2017 1526   K 4.3 03/27/2017 1526   CL 103 03/27/2017 1526   CO2 30 03/27/2017 1526   BUN 17 03/27/2017 1526   CREATININE 1.22 03/27/2017 1526      Component Value Date/Time   CALCIUM 9.9 03/27/2017 1526   ALKPHOS 47 03/27/2017 1526   AST 16 03/27/2017 1526   ALT 20 03/27/2017 1526    BILITOT 0.5 03/27/2017 1526       IMPRESSION AND PLAN:  Recurrent R OM, with chronic right TM perforation. Augmentin 875mg  bid x7d.  Ciprodex 4 gtts R ear bid x 7d. I gave RF of each rx since he is good at recognizing the sx's of recurrence. He knows to return if sx's not improving with 48h of abx or if fever and/or severe ear pain and/or swelling ear. Avoid swimming for at least a week, wear plug in R EAC when swimming in the future.  An After Visit Summary was printed and given to the patient.  FOLLOW UP: Return if symptoms worsen or fail to improve.  Signed:  Crissie Sickles, MD           02/16/2019

## 2019-08-17 ENCOUNTER — Encounter: Payer: Self-pay | Admitting: Family Medicine

## 2019-08-17 ENCOUNTER — Other Ambulatory Visit: Payer: Self-pay

## 2019-08-17 MED ORDER — AMITRIPTYLINE HCL 25 MG PO TABS: 25.0000 mg | ORAL_TABLET | Freq: Every day | ORAL | 0 refills | Status: DC

## 2019-08-17 NOTE — Telephone Encounter (Signed)
I can do this. I can send the med in and get him restarted on it now but have him come in sometime this week or next week.-thx

## 2019-08-17 NOTE — Telephone Encounter (Signed)
Please advise, thanks. Some openings available for this week

## 2019-08-20 ENCOUNTER — Ambulatory Visit: Payer: BC Managed Care – PPO | Admitting: Family Medicine

## 2019-08-27 ENCOUNTER — Ambulatory Visit: Payer: 59 | Admitting: Family Medicine

## 2019-08-27 ENCOUNTER — Encounter: Payer: Self-pay | Admitting: Family Medicine

## 2019-08-27 ENCOUNTER — Other Ambulatory Visit: Payer: Self-pay

## 2019-08-27 VITALS — BP 134/75 | HR 78 | Temp 98.5°F | Ht 69.0 in | Wt 183.0 lb

## 2019-08-27 DIAGNOSIS — E538 Deficiency of other specified B group vitamins: Secondary | ICD-10-CM

## 2019-08-27 DIAGNOSIS — R1115 Cyclical vomiting syndrome unrelated to migraine: Secondary | ICD-10-CM | POA: Diagnosis not present

## 2019-08-27 MED ORDER — AMITRIPTYLINE HCL 25 MG PO TABS: 25.0000 mg | ORAL_TABLET | Freq: Every day | ORAL | 3 refills | Status: DC

## 2019-08-27 NOTE — Progress Notes (Signed)
OFFICE VISIT  5/63/8756   CC: f/u cyclic vomiting  HPI:    Patient is a 24 y.o. Caucasian male who presents for f/u cyclical vomiting disorder. Was improved on amitriptyline as per GI MD mgmt: less nausea and belching and bloating, less episodes of vomiting. Has been of the med last few months and felt gradual return of all sx's, called a few days ago requesting to get back on amitrip until he can get in to see his GI MD. I did eRx this med and asked him to f/u today.    PMP AWARE reviewed today: most recent rx for lorazepam 1mg  was filled 02/05/18, # [phone], rx by Scherrie November, MD in [place], [place]. No red flags.  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 Has been doing great since getting back on amitriptyline.  Says 25mg  qhs dose has been the dose that brought good results in the past long term, tolerates this well (some morning grogginess but tolerable). Last episode of actual vomiting was about 2 yrs ago. After stopping amitript a few months ago he had return of some nausea, bloating/belching that are his typical precursor symptoms that have led to acute vomiting episodes in the past.  He has had to take showers to abort these occ recently since getting off the med.   Last GI MD he saw was Dr. Hilarie Fredrickson.  Says it was going to be 10/2019 before he could get in to see him in order to get routine f/u and RF of med.  Hx of vit B12 def: he still takes vit b12 tab daily.  He has no new problems today.  ROS: no CP, no SOB, no wheezing, no cough, no dizziness, no HAs, no rashes, no melena/hematochezia.  No polyuria or polydipsia.  No myalgias or arthralgias.  No paresthesias.  No abd pain or distention.     Past Medical History:  Diagnosis Date  . Allergy   . Cyclical vomiting 4332   2005, again 2012.  Abd u/s NORMAL 04/2017.  GI (Dr. Hilarie Fredrickson) eval 07/2017: EGD done, gastric and duodenal bx's neg; pt started on amitriptyline 50mg  qhs, coQ10, and L  carnitine.  2nd opinion with [facility] (Dr. Derrill Kay) and 4 hr gastr emptying study, electroesophagram, hydrog breath test: all results normal.  Amitrip inc to 75mg  qd, Ativan added as abortifascient--as of 01/08/18 [facility] F/u.  Marland Kitchen GERD (gastroesophageal reflux disease)    Celiac w/u neg, 2H GTT NORMAL (2016)  . History of heart murmur in childhood    Age 79; eval by cardiologist for a few episodes of mildly elevated bp,  This cardiac eval was normal, including echo and EKG (2012).  . Tympanic membrane perforation, right 2019   +conductive hearing loss-->watchful waiting approach as per ENT f/u visit 10/2017.  . Vitamin B12 deficiency 03/28/2018   IM replacement regimen done, then vit B12 1046mcg q 2wks briefly, then switched over to oral vit b12 05/2018-->f/u B12 level normal 08/2018.    Past Surgical History:  Procedure Laterality Date  . ADENOIDECTOMY  2000  . ESOPHAGOGASTRODUODENOSCOPY  07/26/2017   Gastric and duodenal biopsies normal.   . TYMPANOSTOMY TUBE PLACEMENT     with hx of TM perf on R as of 08/2016 f/u with Dr. Redmond Baseman.  Hx of conductive hearing loss.  . WISDOM TOOTH EXTRACTION      Outpatient Medications Prior to Visit  Medication Sig Dispense Refill  . amitriptyline (ELAVIL) 25 MG  tablet Take 1 tablet (25 mg total) by mouth at bedtime. 30 tablet 0  . Coenzyme Q-10 200 MG CAPS Take 1 capsule by mouth 2 (two) times daily.     Marland Kitchen levOCARNitine Fumarate 500 MG TABS Take 1 tablet by mouth daily.     . vitamin B-12 (CYANOCOBALAMIN) 1000 MCG tablet     . LORazepam (ATIVAN) 1 MG tablet Take 1 mg by mouth as needed.   0  . ciprofloxacin-dexamethasone (CIPRODEX) OTIC suspension Place 4 drops into the right ear 2 (two) times daily. (Patient not taking: Reported on 08/27/2019) 7.5 mL 1  . cyanocobalamin (,VITAMIN B-12,) 1000 MCG/ML injection 1 ml IM q 2 weeks 10 mL 1  . ranitidine (ZANTAC 75) 75 MG tablet Take 1 tablet (75 mg total) by mouth 2 (two) times daily. 60 tablet 2   No  facility-administered medications prior to visit.    Allergies  Allergen Reactions  . Haldol [Haloperidol Lactate] Other (See Comments)    Agitation  . Zofran [Ondansetron Hcl]     Other reaction(s): Other (See Comments) unknown    ROS As per HPI  PE: Blood pressure 134/75, pulse 78, temperature 98.5 F (36.9 C), temperature source Oral, height 5\' 9"  (1.753 m), weight 183 lb (83 kg), SpO2 100 %. Gen: Alert, well appearing.  Patient is oriented to person, place, time, and situation. AFFECT: pleasant, lucid thought and speech. CV: RRR, no m/r/g.   LUNGS: CTA bilat, nonlabored resps, good aeration in all lung fields. ABD: soft, NT, ND, BS normal.  No hepatospenomegaly or mass.  No bruits. EXT: no clubbing or cyanosis.  no edema.    LABS:   Lab Results  Component Value Date   VITAMINB12 556 08/12/2018    Lab Results  Component Value Date   WBC 5.1 03/27/2017   HGB 15.4 03/27/2017   HCT 45.3 03/27/2017   MCV 87.3 03/27/2017   PLT 248.0 03/27/2017   Lab Results  Component Value Date   CREATININE 1.22 03/27/2017   BUN 17 03/27/2017   NA 139 03/27/2017   K 4.3 03/27/2017   CL 103 03/27/2017   CO2 30 03/27/2017   Lab Results  Component Value Date   ALT 20 03/27/2017   AST 16 03/27/2017   ALKPHOS 47 03/27/2017   BILITOT 0.5 03/27/2017    IMPRESSION AND PLAN:  1) Cyclic vomiting syndrome: stable long term on amitriptyline 25mg  qhs. Had return of PRECURSOR sx's (w/out vomiting episodes) in last few months after stopping the med. Complete resolution of sx's since getting back on amitript about a week ago. Will keep him on this med/dose and rxd 90d supply with 3 RFs. He will still f/u with GI on an as needed basis. No labs indicated today.  2) Vit B12 def: continue 03/29/2017 qd oral supplement.  An After Visit Summary was printed and given to the patient.  FOLLOW UP: Return in about 1 year (around 08/26/2020) for annual CPE (fasting) and f/u cyclic vomiting  syndrome.  Signed:  , MD           08/27/2019

## 2020-01-20 ENCOUNTER — Telehealth: Payer: Self-pay | Admitting: Internal Medicine

## 2020-01-20 NOTE — Telephone Encounter (Signed)
Pt's mother is requesting a call back from a nurse in regards to the pt's cycle. Caller states the pt suffers from cyclic vomiting syndrome, he was recently seen in the [facility] ED on Sun 7/18. Caller would like some advise on what to do

## 2020-01-20 NOTE — Telephone Encounter (Signed)
Son is in the middle of a vomiting cycle. He was seen in the ER on Sunday for IV fluids. He has been drinking green tea, pedialyte and water. His vomiting spells are now about every 12-14 hours. He did urinate this am. States that the last couple of times he drank he felt like it was just sitting on his stomach and he vomited later. Discussed with her perhaps he should do sips and not drink a lot at one time. States he is taking the elavil. Mother wanted to know where she should take him if he had to go back to there ER, discussed with her she should take him to [facility] or [facility]. She verbalized understanding.

## 2020-01-29 ENCOUNTER — Ambulatory Visit (INDEPENDENT_AMBULATORY_CARE_PROVIDER_SITE_OTHER): Payer: 59 | Admitting: Family Medicine

## 2020-01-29 ENCOUNTER — Encounter: Payer: Self-pay | Admitting: Family Medicine

## 2020-01-29 ENCOUNTER — Other Ambulatory Visit: Payer: Self-pay

## 2020-01-29 VITALS — BP 116/77 | HR 97 | Temp 97.9°F | Resp 16 | Ht 69.0 in | Wt 170.8 lb

## 2020-01-29 DIAGNOSIS — R739 Hyperglycemia, unspecified: Secondary | ICD-10-CM

## 2020-01-29 DIAGNOSIS — R1115 Cyclical vomiting syndrome unrelated to migraine: Secondary | ICD-10-CM

## 2020-01-29 DIAGNOSIS — F430 Acute stress reaction: Secondary | ICD-10-CM

## 2020-01-29 LAB — GLUCOSE, POCT (MANUAL RESULT ENTRY): POC Glucose: 116 mg/dl — AB (ref 70–99)

## 2020-01-29 LAB — POCT GLYCOSYLATED HEMOGLOBIN (HGB A1C)
HbA1c POC (<> result, manual entry): 5.2 % (ref 4.0–5.6)
HbA1c, POC (controlled diabetic range): 5.2 % (ref 0.0–7.0)
HbA1c, POC (prediabetic range): 5.2 % — AB (ref 5.7–6.4)
Hemoglobin A1C: 5.2 % (ref 4.0–5.6)

## 2020-01-29 MED ORDER — PROMETHAZINE HCL 12.5 MG PO TABS: ORAL_TABLET | ORAL | 1 refills | Status: AC

## 2020-01-29 NOTE — Progress Notes (Signed)
Office Note 01/29/2020  CC:  Chief Complaint  Patient presents with  . Follow-up    ED visit on 01/17/20    HPI:  Martin Terry is a 24 y.o. male who is here for f/u for [facility] ED visit on 01/17/20 (12 days ago). Reviewed ED records in entirety today: presented for intractable n/v x 2d, CBC, lipase, CMET all normal except WBC 13K and glucose 184. UA showed ketones but o/w normal.  CT abd/pelv with contrast showed no acute abnormality.  Phenergan, ativan, and IVF given.  INTERIM HX: He has been well since getting fluids in the ED.  No further n/v at all.  No diarrhea, abd pain, or fevers.  Appetite is good.  Energy level is good.  No polyuria or polydipsia. Taking amitrip and vitamins, not using the ativan currently, only prn. Needs rx for phenergan to use prn during cyclic vomiting episodes.     Past Medical History:  Diagnosis Date  . Allergy   . Cyclical vomiting 2005   2005, again 2012.  Abd u/s NORMAL 04/2017.  GI (Dr. Rhea Belton) eval 07/2017: EGD done, gastric and duodenal bx's neg; pt started on amitriptyline 50mg  qhs, coQ10, and L carnitine.  2nd opinion with [facility] (Dr. ) and 4 hr gastr emptying study, electroesophagram, hydrog breath test: all results normal.  Amitrip inc to 75mg  qd, Ativan added as abortifascient--as of 01/08/18 [facility] F/u.  GERD (gastroesophageal reflux disease)    Celiac w/u neg, 2H GTT NORMAL (2016)  . History of heart murmur in childhood    Age 23; eval by cardiologist for a few episodes of mildly elevated bp,  This cardiac eval was normal, including echo and EKG (2012).  . Tympanic membrane perforation, right 2019   +conductive hearing loss-->watchful waiting approach as per ENT f/u visit 10/2017.  . Vitamin B12 deficiency 03/28/2018   IM replacement regimen done, then vit B12 11/2017 q 2wks briefly, then switched over to oral vit b12 05/2018-->f/u B12 level normal 08/2018.    Past Surgical History:  Procedure Laterality Date  .  ADENOIDECTOMY  2000  . ESOPHAGOGASTRODUODENOSCOPY  07/26/2017   Gastric and duodenal biopsies normal.   . TYMPANOSTOMY TUBE PLACEMENT     with hx of TM perf on R as of 08/2016 f/u with Dr. 07/28/2017.  Hx of conductive hearing loss.  . WISDOM TOOTH EXTRACTION      Family History  Problem Relation Age of Onset  . Miscarriages / 09/2016 Mother   . Hearing loss Father   . Hyperlipidemia Father   . Arthritis Maternal Grandmother   . Hyperlipidemia Maternal Grandmother   . Colon polyps Maternal Grandmother   . COPD Maternal Grandfather   . Diabetes Maternal Grandfather   . Crohn's disease Maternal Grandfather   . Bladder Cancer Maternal Grandfather   . Colon polyps Maternal Grandfather   . Early death Paternal Grandmother   . Scleroderma Paternal Grandmother   . Breast cancer Paternal Grandmother   . Hearing loss Paternal Grandfather   . Hyperlipidemia Paternal Grandfather   . Celiac disease Sister     Social History   Socioeconomic History  . Marital status: Single    Spouse name: Not on file  . Number of children: Not on file  . Years of education: Not on file  . Highest education level: Not on file  Occupational History  . Occupation: free lancer  Tobacco Use  . Smoking status: Never Smoker  . Smokeless tobacco: Never Used  Vaping Use  . Vaping Use: Never used  Substance and Sexual Activity  . Alcohol use: No  . Drug use: No  . Sexual activity: Not on file  Other Topics Concern  . Not on file  Social History Narrative   Single, no children.   Educ: A.A[employer]   Relocated from [place] to [facility] 2016.   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   No T/A/Ds.   Social Determinants of Health   Financial Resource Strain:   . Difficulty of Paying Living Expenses:   Food Insecurity:   . Worried About Programme researcher, broadcasting/film/video in the Last Year:   . Barista in the Last Year:   Transportation Needs:   . Freight forwarder (Medical):   Marland Kitchen Lack of  Transportation (Non-Medical):   Physical Activity:   . Days of Exercise per Week:   . Minutes of Exercise per Session:   Stress:   . Feeling of Stress :   Social Connections:   . Frequency of Communication with Friends and Family:   . Frequency of Social Gatherings with Friends and Family:   . Attends Religious Services:   . Active Member of Clubs or Organizations:   . Attends Banker Meetings:   Marland Kitchen Marital Status:   Intimate Partner Violence:   . Fear of Current or Ex-Partner:   . Emotionally Abused:   Marland Kitchen Physically Abused:   . Sexually Abused:     Outpatient Medications Prior to Visit  Medication Sig Dispense Refill  . amitriptyline (ELAVIL) 25 MG tablet Take 1 tablet (25 mg total) by mouth at bedtime. 90 tablet 3  . Coenzyme Q-10 200 MG CAPS Take 1 capsule by mouth 2 (two) times daily.     Marland Kitchen levOCARNitine Fumarate 500 MG TABS Take 1 tablet by mouth daily.     . vitamin B-12 (CYANOCOBALAMIN) 1000 MCG tablet     . Ciprofloxacin-Dexamethasone (CIPRODEX OT) Place in ear(s) as needed. (Patient not taking: Reported on 01/29/2020)    . LORazepam (ATIVAN PO) Take by mouth as needed. (Patient not taking: Reported on 01/29/2020)     No facility-administered medications prior to visit.    Allergies  Allergen Reactions  . Haldol [Haloperidol Lactate] Other (See Comments)    Agitation  . Zofran [Ondansetron Hcl]     Other reaction(s): Other (See Comments) unknown    ROS: no fevers, no CP, no SOB, no wheezing, no cough, no dizziness, no HAs, no rashes, no melena/hematochezia.  No polyuria or polydipsia.  No myalgias or arthralgias.  No focal weakness, paresthesias, or tremors.  No acute vision or hearing abnormalities. No n/v/d or abd pain.  No palpitations.    PE; Blood pressure 116/77, pulse 97, temperature 97.9 F (36.6 C), temperature source Oral, resp. rate 16, height 5\' 9"  (1.753 m), weight 170 lb 12.8 oz (77.5 kg), SpO2 97 %. Gen: Alert, well appearing.  Patient is  oriented to person, place, time, and situation. AFFECT: pleasant, lucid thought and speech. No further exam today.  Pertinent labs:  See HPI  POC glucose today is 116 (random) POC HbA1c today is 5.2%.  ASSESSMENT AND PLAN:   1) Hyperglycemia; in the setting of acute illness.   Suspect acute stress rxn. He last ate 2 hrs ago: a full lunch. Glucose today: 116. HbA1c today: 5.2%. Reassured pt.  2) Cyclic vomiting syndrome: most recent episode resolved with IVF. Has been stable using amitriptyline 25mg  qhs so we'll continue this and rx phenergan  for him to have on hand: 12.5mg , 1-2 q6h prn.  An After Visit Summary was printed and given to the patient.  FOLLOW UP:  Return for as needed.  Signed:  Santiago Bumpers, MD           01/29/2020

## 2020-06-28 ENCOUNTER — Telehealth: Payer: Self-pay | Admitting: Family Medicine

## 2020-06-28 NOTE — Telephone Encounter (Signed)
Patient's mother states he took at home Covid test today and it showed positive. Patient has a low fever, head congestion and body aches. Mother would like advice for caring for the patient. Please call her to advise.

## 2020-06-29 NOTE — Telephone Encounter (Signed)
Advised mother of isolation, hand washing, mask, tylenol and ibuprofen for pain and fever, and mucinex for congestion

## 2020-08-04 ENCOUNTER — Other Ambulatory Visit: Payer: Self-pay | Admitting: Family Medicine

## 2020-08-28 ENCOUNTER — Other Ambulatory Visit: Payer: Self-pay | Admitting: Family Medicine

## 2020-08-31 ENCOUNTER — Other Ambulatory Visit: Payer: Self-pay | Admitting: Family Medicine

## 2020-09-01 ENCOUNTER — Other Ambulatory Visit: Payer: Self-pay

## 2020-09-01 ENCOUNTER — Ambulatory Visit (INDEPENDENT_AMBULATORY_CARE_PROVIDER_SITE_OTHER): Payer: 59 | Admitting: Family Medicine

## 2020-09-01 ENCOUNTER — Encounter: Payer: Self-pay | Admitting: Family Medicine

## 2020-09-01 VITALS — BP 121/85 | HR 78 | Temp 97.5°F | Resp 16 | Ht 69.0 in | Wt 178.0 lb

## 2020-09-01 DIAGNOSIS — R1115 Cyclical vomiting syndrome unrelated to migraine: Secondary | ICD-10-CM | POA: Diagnosis not present

## 2020-09-01 MED ORDER — AMITRIPTYLINE HCL 25 MG PO TABS: ORAL_TABLET | ORAL | 3 refills | Status: DC

## 2020-09-01 NOTE — Progress Notes (Signed)
OFFICE VISIT  09/01/2020  CC:  Chief Complaint  Patient presents with  . Follow-up    Cyclic vomiting syndrome    HPI:    Patient is a 25 y.o. Caucasian male who presents for 6 mo f/u cyclic vomiting syndrome. A/P as of last visit: "1) Hyperglycemia; in the setting of acute illness.   Suspect acute stress rxn. He last ate 2 hrs ago: a full lunch. Glucose today: 116. HbA1c today: 5.2%. Reassured pt.  2) Cyclic vomiting syndrome: most recent episode resolved with IVF. 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 Doing fine. Takes coQ10, L carnitine, amitrip 25mg  qd. Appetite good. Has not had any n/v episode at all since last acute ED visit July 2021. Amitript w/out signif adverse side effects.  No acute complaints. Occ skateboarding in his driveway, o/w no exercise.  ROS: no fevers, no CP, no SOB, no wheezing, no cough, no dizziness, no HAs, no rashes, no melena/hematochezia.  No polyuria or polydipsia.  No myalgias or arthralgias.  No focal weakness, paresthesias, or tremors.  No acute vision or hearing abnormalities. No n/v/d or abd pain.  No palpitations.     Past Medical History:  Diagnosis Date  . Allergy   . Cyclical vomiting 2005   2005, again 2012.  Abd u/s NORMAL 04/2017.  GI (Dr. 2013) eval 07/2017: EGD done, gastric and duodenal bx's neg; pt started on amitriptyline 50mg  qhs, coQ10, and L carnitine.  2nd opinion with [facility] (Dr. Rhea Belton) and 4 hr gastr emptying study, electroesophagram, hydrog breath test: all results normal.  Amitrip inc to 75mg  qd, Ativan added as abortifascient--as of 01/08/18 [facility] F/u.  GERD (gastroesophageal reflux disease)    Celiac w/u neg, 2H GTT NORMAL (2016)  . History of heart murmur in childhood    Age 26; eval by cardiologist for a few  episodes of mildly elevated bp,  This cardiac eval was normal, including echo and EKG (2012).  . Tympanic membrane perforation, right 2019   +conductive hearing loss-->watchful waiting approach as per ENT f/u visit 10/2017.  . Vitamin B12 deficiency 03/28/2018   IM replacement regimen done, then vit B12 06-12-2002 q 2wks briefly, then switched over to oral vit b12 05/2018-->f/u B12 level normal 08/2018.    Past Surgical History:  Procedure Laterality Date  . ADENOIDECTOMY  2000  . ESOPHAGOGASTRODUODENOSCOPY  07/26/2017   Gastric and duodenal biopsies normal.   . TYMPANOSTOMY TUBE PLACEMENT     with hx of TM perf on R as of 08/2016 f/u with Dr. 06/2018.  Hx of conductive hearing loss.  . WISDOM TOOTH EXTRACTION      Outpatient Medications Prior to Visit  Medication Sig Dispense Refill  . Coenzyme Q-10 200 MG CAPS Take 1 capsule by mouth 2 (two) times daily.     08/2018 levOCARNitine Fumarate 500 MG TABS Take 1 tablet by mouth daily.     . vitamin B-12 (CYANOCOBALAMIN) 1000 MCG tablet     . amitriptyline (ELAVIL) 25 MG tablet TAKE 1 TABLET BY MOUTH EVERYDAY AT BEDTIME 30 tablet 0  . Ciprofloxacin-Dexamethasone (CIPRODEX OT) Place in ear(s) as needed. (Patient not taking: No sig reported)    . LORazepam (ATIVAN PO) Take by mouth as needed. (Patient not taking: No sig reported)    . promethazine (  PHENERGAN) 12.5 MG tablet 1-2 tabs po q6h prn (Patient not taking: Reported on 09/01/2020) 20 tablet 1   No facility-administered medications prior to visit.    Allergies  Allergen Reactions  . Haldol [Haloperidol Lactate] Other (See Comments)    Agitation  . Zofran [Ondansetron Hcl]     Other reaction(s): Other (See Comments) unknown    ROS As per HPI  PE: Vitals with BMI 09/01/2020 01/29/2020 08/27/2019  Height 5\' 9"  5\' 9"  5\' 9"   Weight 178 lbs 170 lbs 13 oz 183 lbs  BMI 26.27 25.21 27.01  Systolic 121 116  Diastolic 85 77 75  Pulse 78 97 78   Gen: Alert, well appearing.  Patient is oriented  to person, place, time, and situation. AFFECT: pleasant, lucid thought and speech. CV: RRR, no m/r/g.   LUNGS: CTA bilat, nonlabored resps, good aeration in all lung fields. ABD: soft, NT/ND  LABS:  No results found for: TSH Lab Results  Component Value Date   WBC 5.1 03/27/2017   HGB 15.4 03/27/2017   HCT 45.3 03/27/2017   MCV 87.3 03/27/2017   PLT 248.0 03/27/2017   Lab Results  Component Value Date   CREATININE 1.22 03/27/2017   BUN 17 03/27/2017   NA 139 03/27/2017   K 4.3 03/27/2017   CL 103 03/27/2017   CO2 30 03/27/2017   Lab Results  Component Value Date   ALT 20 03/27/2017   AST 16 03/27/2017   ALKPHOS 47 03/27/2017   BILITOT 0.5 03/27/2017   Lab Results  Component Value Date   VITAMINB12 556 08/12/2018    IMPRESSION AND PLAN:  Cyclic vomiting syndrome: doing very well on daily amitriptyline 25mg  (RF'd today), L carnitine, and coQ10.  Has phenergan at home for prn use. Also has loraz (rx'd by EDP in the past) for abortive med but has not had to use it.  He declined Tdap booster and flu vaccine today.  An After Visit Summary was printed and given to the patient.  FOLLOW UP: Return in about 1 year (around 09/01/2021) for f/u cyclic vomiting syndrome.  Signed:  03/29/2017, MD           09/01/2020

## 2021-08-16 ENCOUNTER — Encounter: Payer: Self-pay | Admitting: Family Medicine

## 2021-08-17 NOTE — Telephone Encounter (Signed)
Please review and advise.

## 2021-08-30 MED ORDER — DOXYCYCLINE HYCLATE 100 MG PO CAPS: 100.0000 mg | ORAL_CAPSULE | Freq: Two times a day (BID) | ORAL | 0 refills | Status: DC

## 2021-08-30 NOTE — Addendum Note (Signed)
Addended by: Jeoffrey Massed on: 08/30/2021 05:12 PM ? ? Modules accepted: Orders ? ?

## 2021-08-30 NOTE — Telephone Encounter (Signed)
Martin Terry, I sent patient a MyChart message. ?Antibiotics sent in ?

## 2021-08-31 NOTE — Telephone Encounter (Signed)
Noted. Nothing further needed. Will close out this encounter ?

## 2021-09-04 ENCOUNTER — Other Ambulatory Visit: Payer: Self-pay

## 2021-09-04 MED ORDER — AMITRIPTYLINE HCL 25 MG PO TABS
ORAL_TABLET | ORAL | 0 refills | Status: AC
Start: 2021-09-04 — End: ?

## 2021-09-04 NOTE — Addendum Note (Signed)
Addended by: Deveron Furlong D on: 09/04/2021 02:52 PM ? ? Modules accepted: Orders ? ?

## 2021-09-04 NOTE — Addendum Note (Signed)
Addended by: Tammi Sou on: 09/04/2021 09:12 AM ? ? Modules accepted: Orders ? ?

## 2021-09-04 NOTE — Telephone Encounter (Signed)
I can do 90 tabs to last the patient until he can find a local PCP--- I pended the prescription, please send to the pharmacy of patient's choice. ?

## 2022-01-03 ENCOUNTER — Telehealth: Payer: Self-pay

## 2022-01-03 NOTE — Telephone Encounter (Signed)
Mom is stating patient is needing ear drops. She stated that provider is aware of situation regarding Tymier's ears.  They have moved to [place] very recent and do not have PCP yet, because of his health history, mom does not think she will get help with any doctor in [place] because of not having any medical records. Please call Mom Deanna [phone].  [facility]  Ciprofloxacin-Dexamethasone [place] OT) [[phone]]

## 2022-01-04 NOTE — Telephone Encounter (Signed)
Called number listed below. LVM for mom to CB.  Note: Pt will need appt with a provider to receive medication. Pt has not been seen in over a year in this office.

## 2022-01-05 NOTE — Telephone Encounter (Signed)
Spoke with Deanna in re to pt refill request. Deanna stated that they have been able to complete a telehealth visit and received the medication. Pt will try to est with new PCP locally soon.
